# Patient Record
Sex: Male | Born: 1965
Health system: Southern US, Community
[De-identification: ages and names within clinical notes are randomized; demographics above are authoritative.]

## PROBLEM LIST (undated history)

## (undated) DIAGNOSIS — Z9109 Other allergy status, other than to drugs and biological substances: Secondary | ICD-10-CM

## (undated) DIAGNOSIS — G4730 Sleep apnea, unspecified: Secondary | ICD-10-CM

## (undated) DIAGNOSIS — R569 Unspecified convulsions: Secondary | ICD-10-CM

## (undated) HISTORY — PX: SEPTOPLASTY: SUR1290

## (undated) HISTORY — PX: PALATE / UVULA BIOPSY / EXCISION: SUR128

## (undated) HISTORY — DX: Sleep apnea, unspecified: G47.30

## (undated) HISTORY — DX: Unspecified convulsions: R56.9

## (undated) HISTORY — DX: Other allergy status, other than to drugs and biological substances: Z91.09

---

## 2004-08-16 ENCOUNTER — Emergency Department (HOSPITAL_COMMUNITY): Admission: EM | Admit: 2004-08-16 | Discharge: 2004-08-16 | Payer: Self-pay | Admitting: Family Medicine

## 2004-12-22 ENCOUNTER — Ambulatory Visit: Payer: Self-pay | Admitting: Internal Medicine

## 2005-01-19 ENCOUNTER — Ambulatory Visit: Payer: Self-pay | Admitting: Internal Medicine

## 2005-01-26 ENCOUNTER — Ambulatory Visit: Payer: Self-pay | Admitting: Internal Medicine

## 2005-02-08 ENCOUNTER — Ambulatory Visit: Payer: Self-pay | Admitting: Internal Medicine

## 2006-12-12 ENCOUNTER — Ambulatory Visit: Payer: Self-pay | Admitting: Internal Medicine

## 2006-12-12 LAB — CONVERTED CEMR LAB
ALT: 20 units/L (ref 0–40)
AST: 22 units/L (ref 0–37)
Albumin: 4.3 g/dL (ref 3.5–5.2)
Alkaline Phosphatase: 57 units/L (ref 39–117)
BUN: 17 mg/dL (ref 6–23)
Basophils Absolute: 0 10*3/uL (ref 0.0–0.1)
Calcium: 9.5 mg/dL (ref 8.4–10.5)
Chloride: 106 meq/L (ref 96–112)
Cholesterol: 232 mg/dL (ref 0–200)
Creatinine, Ser: 0.8 mg/dL (ref 0.4–1.5)
MCHC: 34.4 g/dL (ref 30.0–36.0)
Monocytes Relative: 9.7 % (ref 3.0–11.0)
Platelets: 204 10*3/uL (ref 150–400)
Potassium: 4.6 meq/L (ref 3.5–5.1)
RBC: 5.19 M/uL (ref 4.22–5.81)
RDW: 12.8 % (ref 11.5–14.6)
TSH: 1.29 microintl units/mL (ref 0.35–5.50)
Total Bilirubin: 0.9 mg/dL (ref 0.3–1.2)
Total CHOL/HDL Ratio: 3.5
Triglycerides: 93 mg/dL (ref 0–149)
VLDL: 19 mg/dL (ref 0–40)

## 2006-12-25 DIAGNOSIS — E785 Hyperlipidemia, unspecified: Secondary | ICD-10-CM | POA: Insufficient documentation

## 2006-12-26 ENCOUNTER — Ambulatory Visit: Payer: Self-pay | Admitting: Internal Medicine

## 2007-03-12 ENCOUNTER — Encounter: Payer: Self-pay | Admitting: *Deleted

## 2007-03-18 ENCOUNTER — Ambulatory Visit: Payer: Self-pay | Admitting: Internal Medicine

## 2007-03-18 LAB — CONVERTED CEMR LAB
Total CHOL/HDL Ratio: 3.3
Triglycerides: 61 mg/dL (ref 0–149)

## 2008-09-27 ENCOUNTER — Telehealth: Payer: Self-pay | Admitting: Internal Medicine

## 2008-09-29 ENCOUNTER — Ambulatory Visit: Payer: Self-pay | Admitting: Internal Medicine

## 2008-09-29 DIAGNOSIS — M4712 Other spondylosis with myelopathy, cervical region: Secondary | ICD-10-CM | POA: Insufficient documentation

## 2008-10-13 ENCOUNTER — Ambulatory Visit: Payer: Self-pay | Admitting: Internal Medicine

## 2008-10-14 ENCOUNTER — Encounter: Payer: Self-pay | Admitting: Internal Medicine

## 2010-01-27 IMAGING — CR DG CERVICAL SPINE WITH FLEX & EXTEND
7 series · 7 of 7 positions shown · non-contrast
Comparison: None

CLINICAL DATA: Cervical spondylosis and myelopathy.

CERVICAL SPINE COMPLETE WITH FLEXION AND EXTENSION VIEWS - 7 views

[view not recorded (1 of 7)]
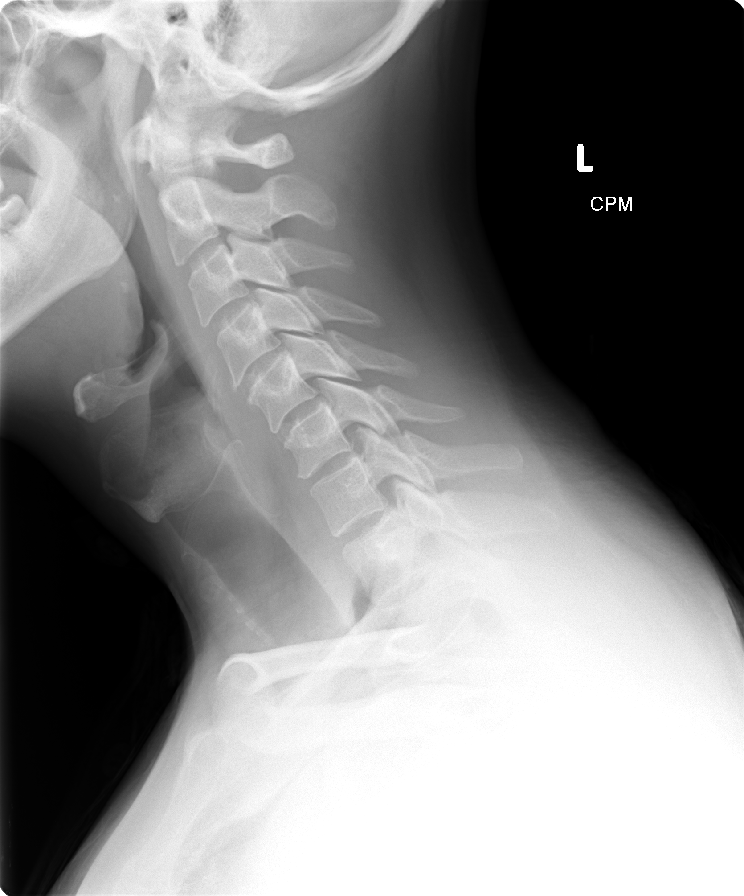

[view not recorded (2 of 7)]
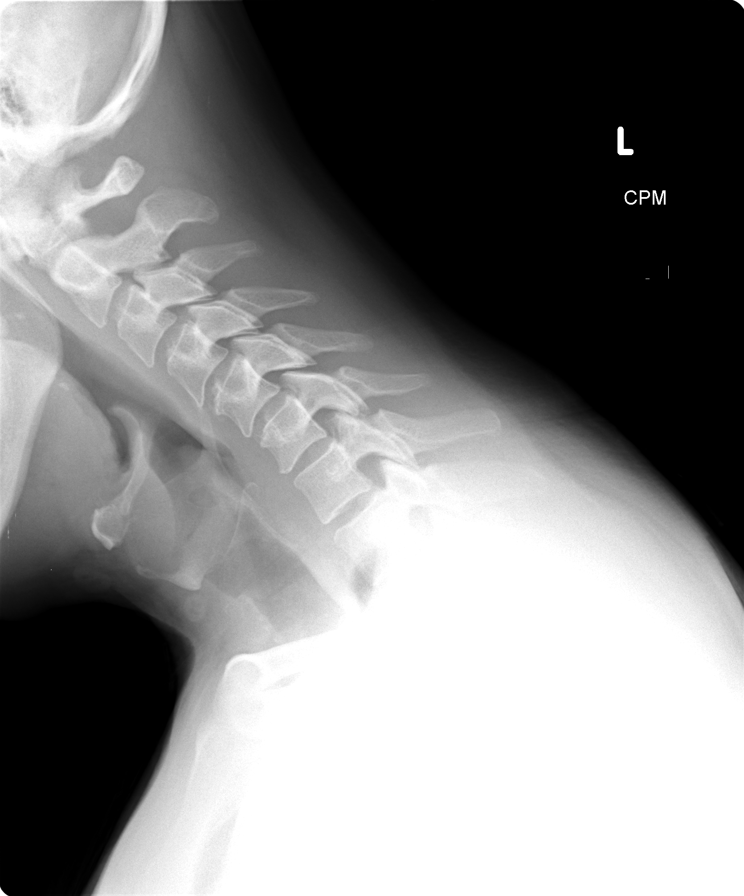

[view not recorded (3 of 7)]
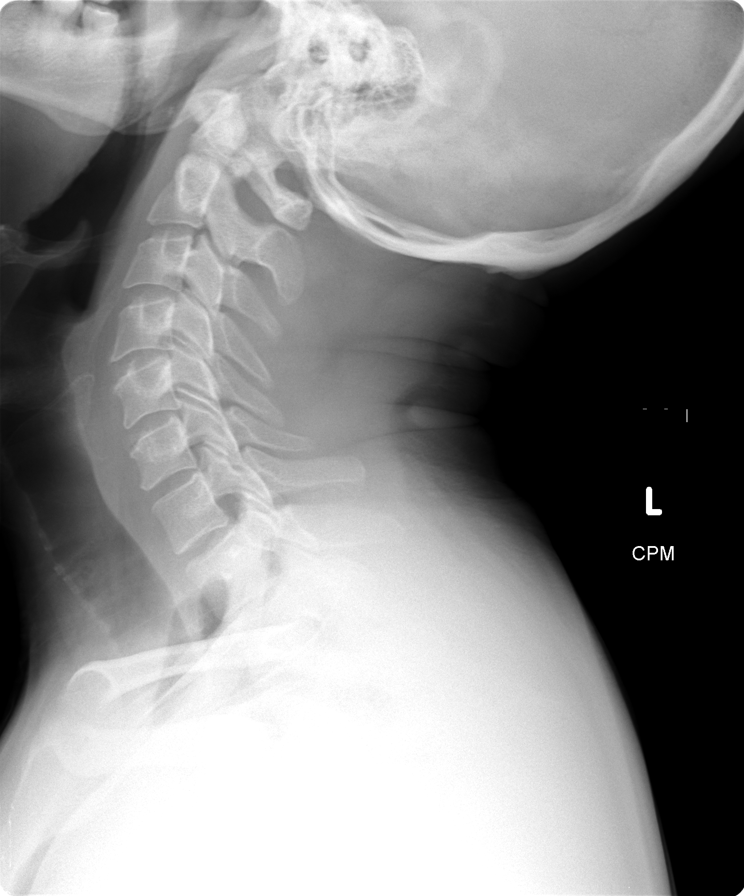

[view not recorded (4 of 7)]
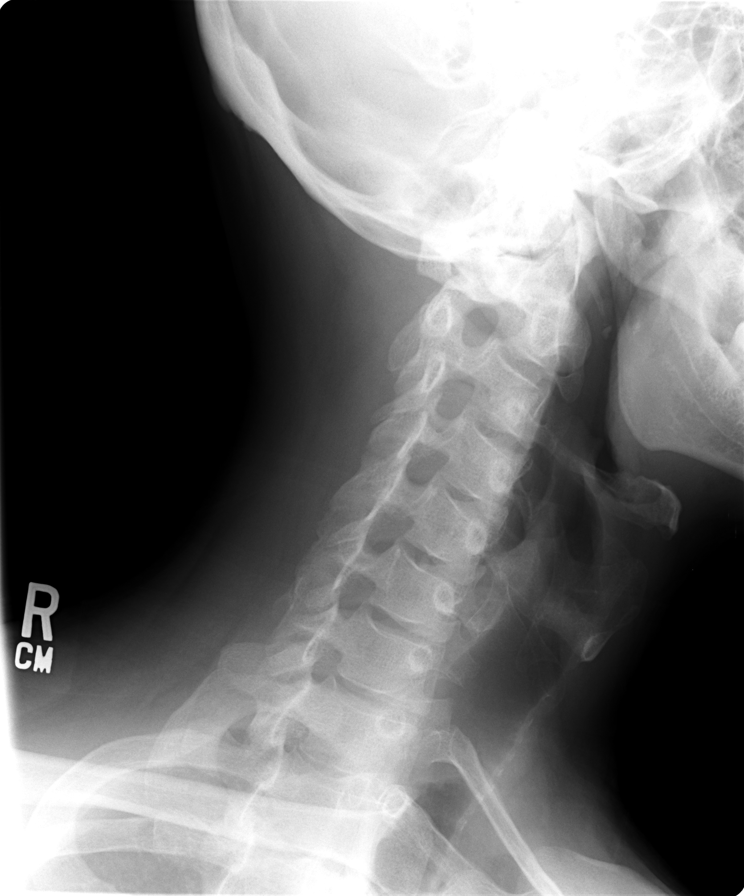

[view not recorded (5 of 7)]
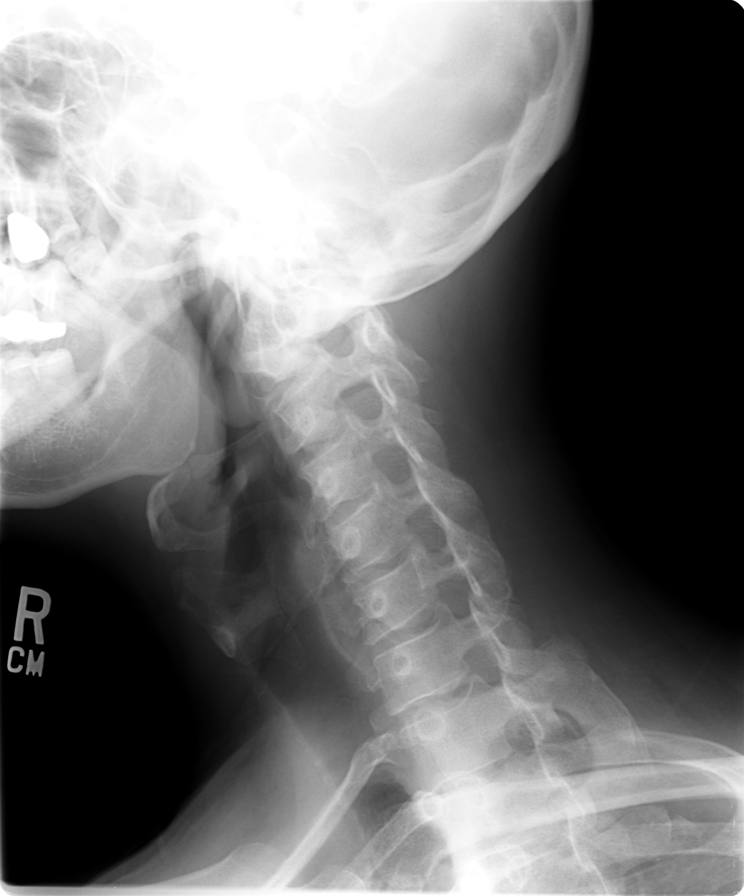

[view not recorded (6 of 7)]
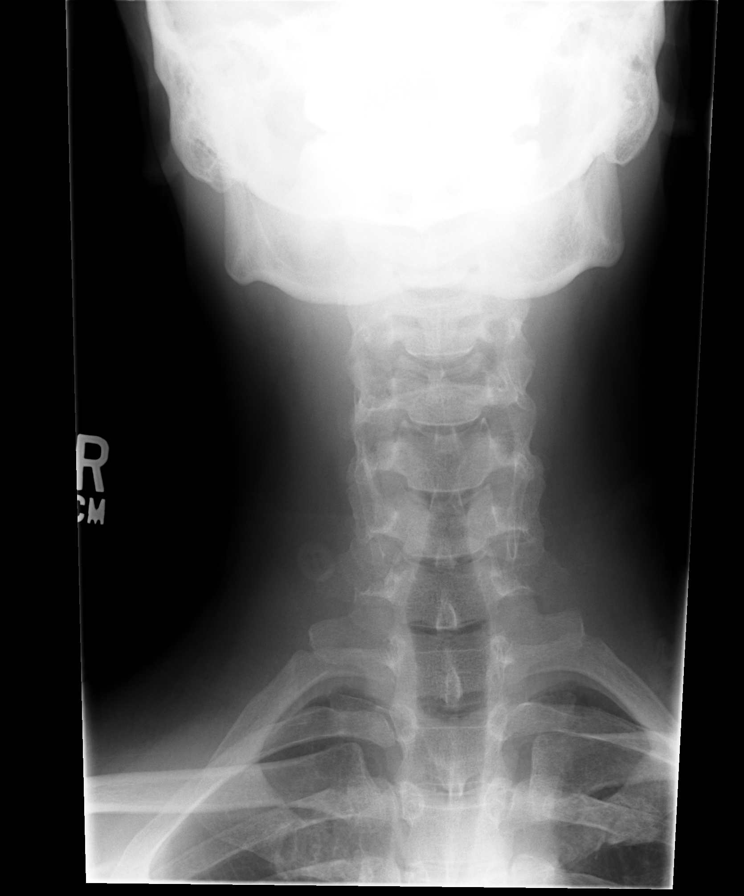

[view not recorded (7 of 7)]
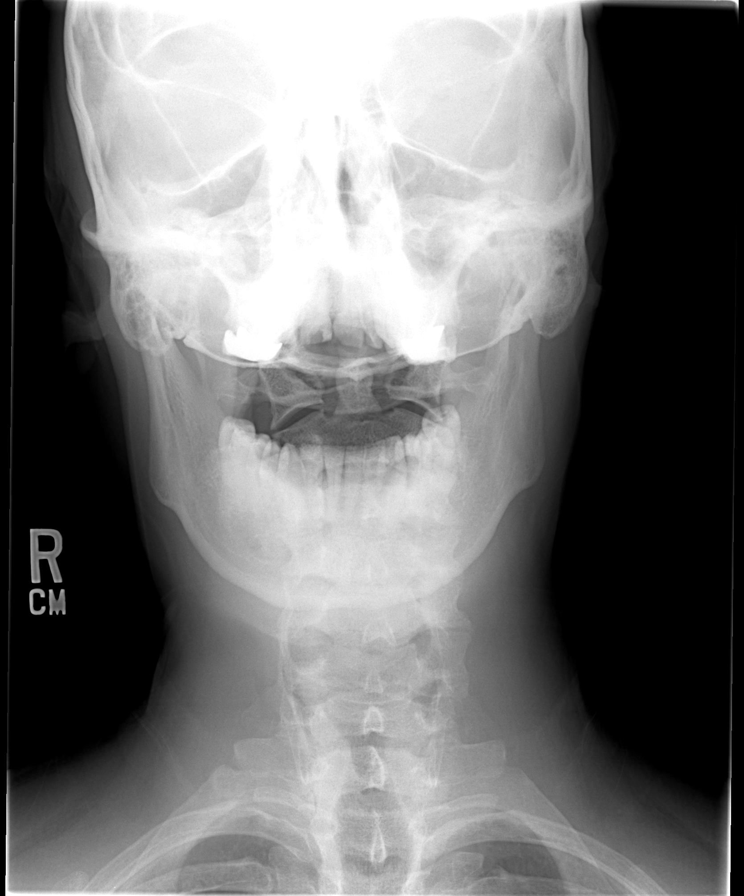

[7 of 7 positions shown; findings below may reference images not displayed]

FINDINGS: Routine cervical spine series shows no evidence of
cervical spine fracture, subluxation, or prevertebral soft tissue
swelling.  Mild anterior vertebral osteophyte formation is seen at
C4-5, C5-6, and C6-7, without evidence of disc space narrowing.
There is no evidence of facet arthropathy or neural foraminal
narrowing.

Mild cervical kyphosis is seen in neutral position.  Flexion and
extension views show normal range of motion, without evidence of
subluxation or other signs of cervical instability.
IMPRESSION: 1.  Early degenerative vertebral osteophyte formation from C4-C7,
without significant disc space narrowing.  Cervical kyphosis also
noted.
2.  No evidence of cervical subluxation or other signs of
ligamentous instability on flexion and extension views.

## 2010-05-24 ENCOUNTER — Ambulatory Visit: Payer: Self-pay | Admitting: Family Medicine

## 2010-05-24 DIAGNOSIS — J069 Acute upper respiratory infection, unspecified: Secondary | ICD-10-CM | POA: Insufficient documentation

## 2010-05-24 LAB — CONVERTED CEMR LAB: Rapid Strep: NEGATIVE

## 2010-09-12 NOTE — Assessment & Plan Note (Signed)
Summary: sore throat/stuffy nose/chest congestion/cjr   Vital Signs:  Patient profile:   45 year old male Weight:      171 pounds Temp:     98.3 degrees F oral BP sitting:   120 / 80  (left arm) Cuff size:   regular  Vitals Entered By: Sid Falcon LPN (May 24, 2010 3:54 PM)  History of Present Illness: Patient seen with one day history of sore throat, nasal congestion and body aches. Some malaise. No fever. No cough.  Around couple of individuals who had similar symptoms over the weekend. Nonsmoker. Took some Advil cold and sinus without much improvement.  Allergies (verified): No Known Drug Allergies  Past History:  Past Medical History: Last updated: 12/25/2006 Seizure disorder Hyperlipidemia/272.4  Review of Systems      See HPI  Physical Exam  General:  Well-developed,well-nourished,in no acute distress; alert,appropriate and cooperative throughout examination Ears:  External ear exam shows no significant lesions or deformities.  Otoscopic examination reveals clear canals, tympanic membranes are intact bilaterally without bulging, retraction, inflammation or discharge. Hearing is grossly normal bilaterally. Mouth:  Oral mucosa and oropharynx without lesions or exudates.  Teeth in good repair. Neck:  No deformities, masses, or tenderness noted. Lungs:  Normal respiratory effort, chest expands symmetrically. Lungs are clear to auscultation, no crackles or wheezes. Heart:  Normal rate and regular rhythm. S1 and S2 normal without gallop, murmur, click, rub or other extra sounds.   Impression & Recommendations:  Problem # 1:  SORE THROAT (ICD-462) strep neg.  Suspect viral URI.  Allerx and cont NSAIDS for symtpom relief. Orders: Rapid Strep (17616)  Complete Medication List: 1)  Phenytoin Sodium Extended 100 Mg Caps (Phenytoin sodium extended) .... 300 every other day,400 every other day-alternating 2)  Methylprednisolone 4 Mg Tabs (Methylprednisolone) .... Wo by  mouth two times a day for 3 days the one by mouth two times a day for 3 days and then 1 by mouth two times a day for 3 days the 1 by mouth daily for 3 days 3)  Propoxyphene N-apap 100-325 Mg Tabs (Propoxyphene n-apap) .... One by mouth q 4 hour as needed neck pain 4)  Diazepam 2 Mg Tabs (Diazepam) .... One by mouth q 6 hours 5)  Allerx Dose Pack Misc (Pse-methscop & cpm-pe-methscop) .... One by mouth two times a day as needed congestion  Patient Instructions: 1)  Get plenty of rest, drink lots of clear liquids, and use Tylenol or Ibuprofen for fever and comfort. Return in 7-10 days if you're not better: sooner if you'er feeling worse.  Prescriptions: ALLERX DOSE PACK  MISC (PSE-METHSCOP & CPM-PE-METHSCOP) one by mouth two times a day as needed congestion  #1 dose pack x 0   Entered and Authorized by:   Evelena Peat MD   Signed by:   Evelena Peat MD on 05/24/2010   Method used:   Electronically to        [facility] [address] [phone]* (retail)       [address]       [place], [place]  [address]       Ph: [phone] or [phone]       Fax: [phone]   RxID:   [id number]   Laboratory Results    Other Tests  Rapid Strep: negative

## 2010-10-12 ENCOUNTER — Telehealth: Payer: Self-pay | Admitting: *Deleted

## 2010-10-12 ENCOUNTER — Ambulatory Visit: Payer: 59 | Admitting: Internal Medicine

## 2010-10-12 DIAGNOSIS — Z0000 Encounter for general adult medical examination without abnormal findings: Secondary | ICD-10-CM

## 2010-10-12 DIAGNOSIS — Z23 Encounter for immunization: Secondary | ICD-10-CM

## 2010-10-12 MED ORDER — AMOXICILLIN-POT CLAVULANATE 875-125 MG PO TABS: 1.0000 | ORAL_TABLET | Freq: Two times a day (BID) | ORAL | Status: AC

## 2010-10-12 MED ORDER — TETANUS-DIPHTH-ACELL PERTUSSIS 5-2.5-18.5 LF-MCG/0.5 IM SUSP: 0.5000 mL | Freq: Once | INTRAMUSCULAR | Status: DC

## 2010-10-12 MED ORDER — TETANUS-DIPHTH-ACELL PERTUSSIS 5-2-15.5 LF-MCG/0.5 IM SUSP: 0.5000 mL | Freq: Once | INTRAMUSCULAR | Status: DC

## 2010-10-12 NOTE — Telephone Encounter (Signed)
Pt was bitten by a dog last night.  Deep penetrating wound to back of knee.  Per Dr. Lovell Sheehan, get Tdap, and Augmentin 875 mg. One po bid x 7 days.  Pt notified and appt scheduled.

## 2010-10-13 ENCOUNTER — Ambulatory Visit: Payer: Self-pay | Admitting: Internal Medicine

## 2010-12-04 ENCOUNTER — Other Ambulatory Visit: Payer: Self-pay | Admitting: Internal Medicine

## 2010-12-29 NOTE — Assessment & Plan Note (Signed)
[facility]                                 ON-CALL NOTE   XYLAN, SHEILS                        MRN:          [id number]  DATE:08/13/2006                            DOB:          August 05, 1966    PRIMARY CARE PHYSICIAN:  Darryll Capers, M.D.   PHONE NUMBER:  [phone].   The patient states he night have a sinus infection.  He had the onset of  symptoms 48-72 hours ago, low-grade fever, nasal congestion, hoarseness,  and cough without associated facial swelling or local face pain.  Currently is not having a fever and has no major underlying illness.  I  suggested that this could be a viral sinusitis and treat it with saline  Afrin, decongestant, and Tylenol.  If it is continuing in a week or more  without improvement or localized pain, etc., he should follow up in the  office with Dr. Lovell Sheehan or as needed with Berniece Andreas, MD.   Time of phone advice:  3 minutes and 40 seconds     Neta Mends. Panosh, MD  Electronically Signed    WKP/MedQ  DD: 08/13/2006  DT: 08/13/2006  Job #: [id number]

## 2011-09-26 ENCOUNTER — Other Ambulatory Visit: Payer: Self-pay | Admitting: Internal Medicine

## 2011-11-21 ENCOUNTER — Telehealth: Payer: Self-pay | Admitting: Internal Medicine

## 2011-11-21 NOTE — Telephone Encounter (Signed)
Patient called stating that his Therapist contacted the MD to refer him for ADHD meds and he has not heard anything back. Patient requesting a call back with update. Please assist.

## 2011-11-21 NOTE — Telephone Encounter (Signed)
Pt has ov 4-25 to discuss adhd

## 2011-12-06 ENCOUNTER — Ambulatory Visit (INDEPENDENT_AMBULATORY_CARE_PROVIDER_SITE_OTHER): Payer: 59 | Admitting: Internal Medicine

## 2011-12-06 ENCOUNTER — Encounter: Payer: Self-pay | Admitting: Internal Medicine

## 2011-12-06 VITALS — BP 130/74 | HR 72 | Temp 98.2°F | Resp 16 | Ht 70.0 in | Wt 174.0 lb

## 2011-12-06 DIAGNOSIS — T887XXA Unspecified adverse effect of drug or medicament, initial encounter: Secondary | ICD-10-CM

## 2011-12-06 DIAGNOSIS — R569 Unspecified convulsions: Secondary | ICD-10-CM

## 2011-12-06 LAB — HEPATIC FUNCTION PANEL
AST: 19 U/L (ref 0–37)
Albumin: 4.2 g/dL (ref 3.5–5.2)

## 2011-12-06 MED ORDER — LISDEXAMFETAMINE DIMESYLATE 30 MG PO CAPS: 30.0000 mg | ORAL_CAPSULE | ORAL | Status: DC

## 2011-12-06 NOTE — Patient Instructions (Signed)
The warning signs that this medication is not for you. 1. heart palpitations  2. Angry outbursts  3. Lack of focus  Stop the drug immediately if any of these 3 appear and contact our office Followup in 30 days to assess effectiveness to see if we need to increase the dose or change directions but at any time any of the 3 warning signs occur we will stop the drug and move in a different direction

## 2011-12-06 NOTE — Progress Notes (Signed)
  Subjective:    Patient ID: Bryan Archer, male    DOB: 08-25-1965, 46 y.o.   MRN: [id number]  HPI  The patient is a 46 year old of persist for followup of history of seizure disorder on stable medication management with last seizure being in 2001.  Patient also has a chief complaint today of attention deficit disorder and wishes to discuss evaluation and treatment plan for that.    Review of Systems  Constitutional: Negative for fever and fatigue.  HENT: Negative for hearing loss, congestion, neck pain and postnasal drip.   Eyes: Negative for discharge, redness and visual disturbance.  Respiratory: Negative for cough, shortness of breath and wheezing.   Cardiovascular: Negative for leg swelling.  Gastrointestinal: Negative for abdominal pain, constipation and abdominal distention.  Genitourinary: Negative for urgency and frequency.  Musculoskeletal: Negative for joint swelling and arthralgias.  Skin: Negative for color change and rash.  Neurological: Negative for weakness and light-headedness.  Hematological: Negative for adenopathy.  Psychiatric/Behavioral: Negative for behavioral problems.   Past Medical History  Diagnosis Date  . Seizures     History   Social History  . Marital Status: Single    Spouse Name: N/A    Number of Children: N/A  . Years of Education: N/A   Occupational History  . Not on file.   Social History Main Topics  . Smoking status: Not on file  . Smokeless tobacco: Not on file  . Alcohol Use:   . Drug Use:   . Sexually Active:    Other Topics Concern  . Not on file   Social History Narrative  . No narrative on file    No past surgical history on file.  No family history on file.  No Known Allergies  Current Outpatient Prescriptions on File Prior to Visit  Medication Sig Dispense Refill  . phenytoin (DILANTIN) 100 MG ER capsule TAKE 3 CAPSULES EVERY OTHER DAY ALTERNATING WITH 4 CAPSULE EVERY OTHER DAY  360 capsule  1   Current  Facility-Administered Medications on File Prior to Visit  Medication Dose Route Frequency Provider Last Rate Last Dose  . DISCONTD: TDaP (ADACEL) injection 0.5 mL  0.5 mL Intramuscular Once Stacie Glaze, MD      . DISCONTD: TDaP (BOOSTRIX) injection 0.5 mL  0.5 mL Intramuscular Once Stacie Glaze, MD        BP 130/74  Pulse 72  Temp 98.2 F (36.8 C)  Resp 16  Ht 5\' 10"  (1.778 m)  Wt 174 lb (78.926 kg)  BMI 24.97 kg/m2       Objective:   Physical Exam  Nursing note and vitals reviewed. Constitutional: He appears well-developed and well-nourished.  HENT:  Head: Normocephalic and atraumatic.  Eyes: Conjunctivae are normal. Pupils are equal, round, and reactive to light.  Neck: Normal range of motion. Neck supple.  Cardiovascular: Normal rate and regular rhythm.   Pulmonary/Chest: Effort normal and breath sounds normal.  Abdominal: Soft. Bowel sounds are normal.          Assessment & Plan:  ADD New diagnosis referred by therapist to begin ADD medications discussed the use of 5 bands 30 mg by mouth daily for ADD with followup.  Discussed case with neurology to make sure that the use of an EGD drug in a patient with a history of epilepsy is safe. Neurology consult and confirmed that the use of low-dose ADD medications was a prudent path And  Hx of seizures disorder

## 2012-04-13 ENCOUNTER — Other Ambulatory Visit: Payer: Self-pay | Admitting: Internal Medicine

## 2012-07-12 ENCOUNTER — Other Ambulatory Visit: Payer: Self-pay | Admitting: Internal Medicine

## 2012-11-15 ENCOUNTER — Other Ambulatory Visit: Payer: Self-pay | Admitting: Internal Medicine

## 2013-10-12 ENCOUNTER — Other Ambulatory Visit: Payer: 59

## 2013-10-12 ENCOUNTER — Other Ambulatory Visit: Payer: Self-pay | Admitting: Family Medicine

## 2013-10-12 DIAGNOSIS — Z209 Contact with and (suspected) exposure to unspecified communicable disease: Secondary | ICD-10-CM

## 2013-10-12 LAB — HIV ANTIBODY (ROUTINE TESTING W REFLEX): HIV: NONREACTIVE

## 2013-10-13 ENCOUNTER — Telehealth: Payer: Self-pay | Admitting: Family Medicine

## 2013-10-13 NOTE — Telephone Encounter (Signed)
He was given the results of his HIV test

## 2013-12-27 ENCOUNTER — Other Ambulatory Visit: Payer: Self-pay | Admitting: Internal Medicine

## 2013-12-29 NOTE — Telephone Encounter (Signed)
Not sure how I got this!

## 2014-03-05 ENCOUNTER — Other Ambulatory Visit: Payer: 59

## 2014-03-12 ENCOUNTER — Encounter: Payer: 59 | Admitting: Internal Medicine

## 2014-03-26 ENCOUNTER — Other Ambulatory Visit: Payer: Self-pay | Admitting: Internal Medicine

## 2014-03-29 ENCOUNTER — Telehealth: Payer: Self-pay

## 2014-03-29 NOTE — Telephone Encounter (Signed)
Left message on machine dr.lalonde wants pt to have a med check please call and make appointment

## 2014-04-06 ENCOUNTER — Encounter: Payer: Self-pay | Admitting: Family Medicine

## 2014-04-06 ENCOUNTER — Ambulatory Visit (INDEPENDENT_AMBULATORY_CARE_PROVIDER_SITE_OTHER): Payer: 59 | Admitting: Family Medicine

## 2014-04-06 VITALS — BP 124/78 | HR 72 | Wt 163.0 lb

## 2014-04-06 DIAGNOSIS — E785 Hyperlipidemia, unspecified: Secondary | ICD-10-CM

## 2014-04-06 DIAGNOSIS — R569 Unspecified convulsions: Secondary | ICD-10-CM

## 2014-04-06 LAB — CBC WITH DIFFERENTIAL/PLATELET
Basophils Absolute: 0 10*3/uL (ref 0.0–0.1)
Basophils Relative: 1 % (ref 0–1)
EOS ABS: 0.1 10*3/uL (ref 0.0–0.7)
EOS PCT: 2 % (ref 0–5)
HCT: 42.5 % (ref 39.0–52.0)
Hemoglobin: 15.1 g/dL (ref 13.0–17.0)
LYMPHS ABS: 1 10*3/uL (ref 0.7–4.0)
Lymphocytes Relative: 29 % (ref 12–46)
MCH: 30.8 pg (ref 26.0–34.0)
MCHC: 35.5 g/dL (ref 30.0–36.0)
MCV: 86.7 fL (ref 78.0–100.0)
Monocytes Absolute: 0.3 10*3/uL (ref 0.1–1.0)
Monocytes Relative: 9 % (ref 3–12)
Neutro Abs: 2.1 10*3/uL (ref 1.7–7.7)
Neutrophils Relative %: 59 % (ref 43–77)
PLATELETS: 190 10*3/uL (ref 150–400)
RBC: 4.9 MIL/uL (ref 4.22–5.81)
RDW: 13.2 % (ref 11.5–15.5)
WBC: 3.6 10*3/uL — ABNORMAL LOW (ref 4.0–10.5)

## 2014-04-06 MED ORDER — PHENYTOIN SODIUM EXTENDED 100 MG PO CAPS: ORAL_CAPSULE | ORAL | Status: DC

## 2014-04-06 NOTE — Progress Notes (Signed)
   Subjective:    Patient ID: Bryan Archer, male    DOB: [birth year], 48 y.o.   MRN: [id number]  HPI He is here for medication check. He will be switching to my care. He does have an underlying seizure disorder and is on Dilantin. His last seizure was in 2000. He was recently diagnosed at age 86 and placed on Dilantin. He did stop the medication on his own in 28. He had his neck seizure in 2000 and has been on Dilantin since then. Her last visit was in 2013. Review his record indicates that he has rarely used Adderall. Apparently was given to help with focus especially when he gets stressed. He has not had any formal evaluation for underlying ADD. He also has a history of hyperlipidemia.   Review of Systems     Objective:   Physical Exam Alert and in no distress otherwise not examined       Assessment & Plan:  SEIZURE DISORDER - Plan: Phenytoin level, free, phenytoin (DILANTIN) 100 MG ER capsule  HYPERLIPIDEMIA - Plan: CBC with Differential, Comprehensive metabolic panel, Lipid panel  He does plan on continuing on Dilantin the rest of his life. Discussed the use of Adderall and at this time I would not pursue making a definitive diagnosis since he rarely uses the medication. I explained that his symptoms could easily be stress related. He will set up for complete examination. He did not want a flu shot.

## 2014-04-07 LAB — COMPREHENSIVE METABOLIC PANEL
ALK PHOS: 50 U/L (ref 39–117)
ALT: 17 U/L (ref 0–53)
AST: 16 U/L (ref 0–37)
Albumin: 4.6 g/dL (ref 3.5–5.2)
BUN: 14 mg/dL (ref 6–23)
CO2: 30 mEq/L (ref 19–32)
CREATININE: 0.85 mg/dL (ref 0.50–1.35)
Calcium: 9.4 mg/dL (ref 8.4–10.5)
Chloride: 105 mEq/L (ref 96–112)
Glucose, Bld: 92 mg/dL (ref 70–99)
Potassium: 4.2 mEq/L (ref 3.5–5.3)
Sodium: 141 mEq/L (ref 135–145)
Total Bilirubin: 0.4 mg/dL (ref 0.2–1.2)
Total Protein: 6.6 g/dL (ref 6.0–8.3)

## 2014-04-07 LAB — LIPID PANEL
CHOLESTEROL: 195 mg/dL (ref 0–200)
HDL: 67 mg/dL (ref >39)
LDL CALC: 108 mg/dL — AB (ref 0–99)
TRIGLYCERIDES: 99 mg/dL (ref <150)
Total CHOL/HDL Ratio: 2.9 Ratio
VLDL: 20 mg/dL (ref 0–40)

## 2014-04-11 LAB — PHENYTOIN LEVEL, FREE AND TOTAL
PHENYTOIN, TOTAL: 6.3 mg/L — AB (ref 10.0–20.0)
Phenytoin Bound: 5.6 mg/L
Phenytoin, Free: 0.7 mg/L — ABNORMAL LOW (ref 1.0–2.0)

## 2014-04-12 NOTE — Addendum Note (Signed)
Addended by: Denita Lung on: 04/12/2014 08:23 AM   Modules accepted: Orders

## 2014-04-28 ENCOUNTER — Encounter: Payer: Self-pay | Admitting: Family Medicine

## 2014-04-28 ENCOUNTER — Ambulatory Visit (INDEPENDENT_AMBULATORY_CARE_PROVIDER_SITE_OTHER): Payer: 59 | Admitting: Family Medicine

## 2014-04-28 VITALS — BP 124/78 | HR 70 | Ht 68.0 in | Wt 165.0 lb

## 2014-04-28 DIAGNOSIS — J309 Allergic rhinitis, unspecified: Secondary | ICD-10-CM

## 2014-04-28 DIAGNOSIS — J3089 Other allergic rhinitis: Secondary | ICD-10-CM

## 2014-04-28 DIAGNOSIS — Z0000 Encounter for general adult medical examination without abnormal findings: Secondary | ICD-10-CM

## 2014-04-28 DIAGNOSIS — E785 Hyperlipidemia, unspecified: Secondary | ICD-10-CM

## 2014-04-28 DIAGNOSIS — R569 Unspecified convulsions: Secondary | ICD-10-CM

## 2014-04-28 DIAGNOSIS — Z209 Contact with and (suspected) exposure to unspecified communicable disease: Secondary | ICD-10-CM

## 2014-04-28 NOTE — Progress Notes (Signed)
   Subjective:    Patient ID: Bryan Archer, male    DOB: 10-20-1965, 48 y.o.   MRN: [id number]  HPI He is here for complete examination. He does have an underlying seizure disorder and presently is on alternating dosing of 300 and 400 mg Dilantin daily. Blood work was done recently. He also has a history of year-round allergies with nasal congestion sinus, sneezing, itchy watery eyes. He is using an OTC medication with good results. He does have a history of hyperlipidemia. His work and home life are going quite well. He did have HIV testing done in March which was negative. He does practice safe sex. Family and social history were reviewed.   Review of Systems  All other systems reviewed and are negative.      Objective:   Physical Exam BP 124/78  Pulse 70  Ht 5\' 8"  (1.727 m)  Wt 165 lb (74.844 kg)  BMI 25.09 kg/m2  SpO2 98%  General Appearance:    Alert, cooperative, no distress, appears stated age  Head:    Normocephalic, without obvious abnormality, atraumatic  Eyes:    PERRL, conjunctiva/corneas clear, EOM's intact, fundi    benign  Ears:    Normal TM's and external ear canals  Nose:   Nares normal, mucosa normal, no drainage or sinus   tenderness  Throat:   Lips, mucosa, and tongue normal; teeth and gums normal  Neck:   Supple, no lymphadenopathy;  thyroid:  no   enlargement/tenderness/nodules; no carotid   bruit or JVD  Back:    Spine nontender, no curvature, ROM normal, no CVA     tenderness  Lungs:     Clear to auscultation bilaterally without wheezes, rales or     ronchi; respirations unlabored  Chest Wall:    No tenderness or deformity   Heart:    Regular rate and rhythm, S1 and S2 normal, no murmur, rub   or gallop  Breast Exam:    No chest wall tenderness, masses or gynecomastia  Abdomen:     Soft, non-tender, nondistended, normoactive bowel sounds,    no masses, no hepatosplenomegaly  Genitalia:    Normal male external genitalia without lesions.  Testicles  without masses.  No inguinal hernias.  Rectal:   deferred   Extremities:   No clubbing, cyanosis or edema  Pulses:   2+ and symmetric all extremities  Skin:   Skin color, texture, turgor normal, no rashes or lesions  Lymph nodes:   Cervical, supraclavicular, and axillary nodes normal  Neurologic:   CNII-XII intact, normal strength, sensation and gait; reflexes 2+ and symmetric throughout          Psych:   Normal mood, affect, hygiene and grooming.          Assessment & Plan:  Routine general medical examination at a health care facility  SEIZURE DISORDER - Plan: Phenytoin level, total  HYPERLIPIDEMIA  Perennial allergic rhinitis  Contact with or exposure to unspecified communicable disease - Plan: HIV antibody  I will have him increase his Dilantin to 400 mg daily and check a level in one month. Continue on present allergy treatment regimen. Discussed PREP with him however he is comfortable with his present protection methods. Flu shot offered but refused.

## 2014-04-29 LAB — HIV ANTIBODY (ROUTINE TESTING W REFLEX): HIV 1&2 Ab, 4th Generation: NONREACTIVE

## 2014-05-28 ENCOUNTER — Other Ambulatory Visit (INDEPENDENT_AMBULATORY_CARE_PROVIDER_SITE_OTHER): Payer: 59

## 2014-05-28 DIAGNOSIS — Z23 Encounter for immunization: Secondary | ICD-10-CM

## 2014-05-28 DIAGNOSIS — R569 Unspecified convulsions: Secondary | ICD-10-CM

## 2014-05-29 LAB — PHENYTOIN LEVEL, TOTAL: Phenytoin Lvl: 8.6 ug/mL — ABNORMAL LOW (ref 10.0–20.0)

## 2014-06-19 ENCOUNTER — Encounter: Payer: Self-pay | Admitting: Family Medicine

## 2014-06-20 ENCOUNTER — Emergency Department (HOSPITAL_COMMUNITY)
Admission: EM | Admit: 2014-06-20 | Discharge: 2014-06-20 | Disposition: A | Discharge status: Home / Self Care | Payer: 59 | Source: Home / Self Care | Attending: Family Medicine | Admitting: Family Medicine

## 2014-06-20 ENCOUNTER — Encounter (HOSPITAL_COMMUNITY): Payer: Self-pay | Admitting: *Deleted

## 2014-06-20 ENCOUNTER — Encounter: Payer: Self-pay | Admitting: Family Medicine

## 2014-06-20 DIAGNOSIS — L2381 Allergic contact dermatitis due to animal (cat) (dog) dander: Secondary | ICD-10-CM

## 2014-06-20 MED ORDER — TRIAMCINOLONE ACETONIDE 40 MG/ML IJ SUSP
40.0000 mg | Freq: Once | INTRAMUSCULAR | Status: AC
  Administered 2014-06-20: 40 mg via INTRAMUSCULAR

## 2014-06-20 MED ORDER — TRIAMCINOLONE ACETONIDE 40 MG/ML IJ SUSP
INTRAMUSCULAR | Status: AC
  Filled 2014-06-20: qty 1

## 2014-06-20 MED ORDER — METHYLPREDNISOLONE ACETATE PF 80 MG/ML IJ SUSP
80.0000 mg | Freq: Once | INTRAMUSCULAR | Status: AC
  Administered 2014-06-20: 80 mg via INTRAMUSCULAR

## 2014-06-20 MED ORDER — METHYLPREDNISOLONE ACETATE 80 MG/ML IJ SUSP
INTRAMUSCULAR | Status: AC
  Filled 2014-06-20: qty 1

## 2014-06-20 NOTE — Discharge Instructions (Signed)
Continue benadryl as needed,see your doctor if further problems

## 2014-06-20 NOTE — ED Notes (Signed)
Pt     Reports       Rash  On  Hands   With  Tingling        And  Swelling          X   3  Days             no  Angio  Edema     Speaking   In  Complete  sentances   And  Is  In no  Acute  Distress     Hands  Are  Red

## 2014-06-20 NOTE — ED Provider Notes (Signed)
CSN: [id number]     Arrival date & time 06/20/14  1610 History   First MD Initiated Contact with Patient 06/20/14 1628     Chief Complaint  Patient presents with  . Rash   (Consider location/radiation/quality/duration/timing/severity/associated sxs/prior Treatment) Patient is a 48 y.o. male presenting with rash. The history is provided by the patient.  Rash Location:  Hand Hand rash location:  R palm and L palm Quality: itchiness and redness   Severity:  Mild Duration:  2 days Progression:  Unchanged Chronicity:  New Context: animal contact   Context comment:  Onset after petting new cat that was adopted. Associated symptoms: no shortness of breath, no throat swelling and no tongue swelling     Past Medical History  Diagnosis Date  . Seizures    History reviewed. No pertinent past surgical history. Family History  Problem Relation Age of Onset  . Stroke Mother   . Heart disease Father 70    CABG   History  Substance Use Topics  . Smoking status: Former Smoker    Quit date: 04/29/1999  . Smokeless tobacco: Not on file  . Alcohol Use: 4.2 oz/week    7 Glasses of wine per week    Review of Systems  Constitutional: Negative.   Respiratory: Negative for shortness of breath.   Musculoskeletal: Negative.   Skin: Positive for rash.    Allergies  Review of patient's allergies indicates no known allergies.  Home Medications   Prior to Admission medications   Medication Sig Start Date End Date Taking? Authorizing Provider  phenytoin (DILANTIN) 100 MG ER capsule TAKE 3 CAPSULES EVERY OTHER DAY, ALTERNATING WITH 4 CAPSULES EVERY OTHER DAY 04/06/14   Denita Lung, MD   BP 119/82 mmHg  Pulse 96  Temp(Src) 97.7 F (36.5 C) (Oral)  Resp 16  SpO2 96% Physical Exam  Constitutional: He is oriented to person, place, and time. He appears well-developed and well-nourished. No distress.  Neurological: He is alert and oriented to person, place, and time.  Skin: Skin is warm  and dry. Rash noted. There is erythema.  Nursing note and vitals reviewed.   ED Course  Procedures (including critical care time) Labs Review Labs Reviewed - No data to display  Imaging Review No results found.   MDM   1. Allergic contact dermatitis due to animal dander        Billy Fischer, MD 06/20/14 [phone]

## 2014-06-21 ENCOUNTER — Encounter: Payer: Self-pay | Admitting: Family Medicine

## 2014-06-21 NOTE — Telephone Encounter (Signed)
Pt called to see if emails had been received. Please email pt.

## 2014-06-22 ENCOUNTER — Ambulatory Visit (INDEPENDENT_AMBULATORY_CARE_PROVIDER_SITE_OTHER): Payer: 59 | Admitting: Family Medicine

## 2014-06-22 ENCOUNTER — Encounter: Payer: Self-pay | Admitting: Family Medicine

## 2014-06-22 VITALS — BP 120/78 | HR 73 | Wt 168.0 lb

## 2014-06-22 DIAGNOSIS — L309 Dermatitis, unspecified: Secondary | ICD-10-CM

## 2014-06-22 NOTE — Patient Instructions (Addendum)
Use Claritin or Allegra and you can also take Tagamet If you are still having difficulty after the weekend call and I will try to get you in to see her dermatologist

## 2014-06-22 NOTE — Progress Notes (Signed)
   Subjective:    Patient ID: Bryan Archer, male    DOB: 07-24-1966, 48 y.o.   MRN: [id number]  HPI He is here for evaluation of a rash. Last Thursday he noted a tingling sensation in his fingers and toes. The problem continued and he was seen in an urgent care center. He was given prednisone and Benadryl last Sunday. This continues to cause difficulty mainly with itching. He has had no new soaps, detergents, medications or known exposure to anything he is aware of.   Review of Systems     Objective:   Physical Exam Alert and in no distress. Exam of both hands does show palmaris erythema with papular lesions present. He also has lesions present between his fingers on the dorsal surface. Exam of his torso and feet was negative.       Assessment & Plan:  Dermatitis I explained that I did not think he was in any danger but cannot state exactly what is causing his trouble. When he returns from a trip to [place], if he still has difficulty he will call me for help expediting seeing a dermatologist. He has seen Dr. Tonia Brooms in the past.

## 2014-07-18 ENCOUNTER — Emergency Department (HOSPITAL_COMMUNITY): Payer: 59

## 2014-07-18 ENCOUNTER — Emergency Department (HOSPITAL_COMMUNITY)
Admission: EM | Admit: 2014-07-18 | Discharge: 2014-07-18 | Disposition: A | Discharge status: Home / Self Care | Payer: 59 | Attending: Emergency Medicine | Admitting: Emergency Medicine

## 2014-07-18 ENCOUNTER — Encounter (HOSPITAL_COMMUNITY): Payer: Self-pay | Admitting: Emergency Medicine

## 2014-07-18 ENCOUNTER — Other Ambulatory Visit: Payer: Self-pay

## 2014-07-18 ENCOUNTER — Emergency Department (HOSPITAL_COMMUNITY)
Admission: EM | Admit: 2014-07-18 | Discharge: 2014-07-18 | Disposition: A | Discharge status: Home / Self Care | Payer: 59 | Source: Home / Self Care | Attending: Family Medicine | Admitting: Family Medicine

## 2014-07-18 ENCOUNTER — Encounter (HOSPITAL_COMMUNITY): Payer: Self-pay | Admitting: *Deleted

## 2014-07-18 ENCOUNTER — Encounter: Payer: Self-pay | Admitting: Family Medicine

## 2014-07-18 DIAGNOSIS — G40909 Epilepsy, unspecified, not intractable, without status epilepticus: Secondary | ICD-10-CM | POA: Insufficient documentation

## 2014-07-18 DIAGNOSIS — Z87891 Personal history of nicotine dependence: Secondary | ICD-10-CM | POA: Insufficient documentation

## 2014-07-18 DIAGNOSIS — R079 Chest pain, unspecified: Secondary | ICD-10-CM

## 2014-07-18 DIAGNOSIS — R0789 Other chest pain: Secondary | ICD-10-CM

## 2014-07-18 LAB — BASIC METABOLIC PANEL
ANION GAP: 10 (ref 5–15)
BUN: 15 mg/dL (ref 6–23)
CO2: 30 mEq/L (ref 19–32)
Calcium: 9.7 mg/dL (ref 8.4–10.5)
Chloride: 103 mEq/L (ref 96–112)
Creatinine, Ser: 0.79 mg/dL (ref 0.50–1.35)
GFR calc non Af Amer: >90 mL/min (ref >90)
Glucose, Bld: 88 mg/dL (ref 70–99)
POTASSIUM: 4.7 meq/L (ref 3.7–5.3)
SODIUM: 143 meq/L (ref 137–147)

## 2014-07-18 LAB — I-STAT TROPONIN, ED
Troponin i, poc: 0 ng/mL (ref 0.00–0.08)
Troponin i, poc: 0 ng/mL (ref 0.00–0.08)

## 2014-07-18 LAB — CBC
HCT: 45.4 % (ref 39.0–52.0)
Hemoglobin: 15.9 g/dL (ref 13.0–17.0)
MCH: 30.8 pg (ref 26.0–34.0)
MCHC: 35 g/dL (ref 30.0–36.0)
MCV: 87.8 fL (ref 78.0–100.0)
PLATELETS: 173 10*3/uL (ref 150–400)
RBC: 5.17 MIL/uL (ref 4.22–5.81)
RDW: 12.4 % (ref 11.5–15.5)
WBC: 3.7 10*3/uL — AB (ref 4.0–10.5)

## 2014-07-18 NOTE — Discharge Instructions (Signed)

## 2014-07-18 NOTE — ED Notes (Addendum)
Pt declined EMS transfer.  States family member will drive him directly to ED.  Attempt to notify ED First RN without success.

## 2014-07-18 NOTE — Discharge Instructions (Signed)
Go directly to the emergency room

## 2014-07-18 NOTE — ED Notes (Signed)
[place], ED First RN notified of pt.

## 2014-07-18 NOTE — ED Notes (Signed)
Reports sudden onset substernal chest pain that has since moved to left chest pain.  Describes as a tightness.  Denies SOB, diaphoresis, n/v, radiation of pain.  Denies hx.  Pain has been constant.  Skin W/D/P.  EKG in progress.

## 2014-07-18 NOTE — ED Provider Notes (Signed)
CSN: [id number]     Arrival date & time 07/18/14  1023 History   First MD Initiated Contact with Patient 07/18/14 1207     Chief Complaint  Patient presents with  . Chest Pain     (Consider location/radiation/quality/duration/timing/severity/associated sxs/prior Treatment) HPI   Bryan Archer is a 48 y.o. male  who developed chest pain, left anterior, while reading paper drink coffee this morning.  The pain was central chest, and is described as a tight feeling" The pain moved to the left side of his chest.  The pain was moderate in intensity initially, and now is mild in intensity.  There was no associated diaphoresis, nausea, vomiting, weakness or dizziness.  Never had this previously.  He has occasional episodes of heartburn for which he takes Tums, but that pain, is different than the discomfort today.  He has never had cardiac problems.  Family history is positive for coronary artery disease in his father at age 32.  He does not smoke cigarettes or use illegal drugs.  He had a change in his Dilantin dosing, 2 months ago.  There are no other known modifying factors.  Past Medical History  Diagnosis Date  . Seizures    History reviewed. No pertinent past surgical history. Family History  Problem Relation Age of Onset  . Stroke Mother   . Heart disease Father 16    CABG   History  Substance Use Topics  . Smoking status: Former Smoker    Quit date: 04/29/1999  . Smokeless tobacco: Not on file  . Alcohol Use: Yes     Comment: occasional    Review of Systems  All other systems reviewed and are negative.     Allergies  Review of patient's allergies indicates no known allergies.  Home Medications   Prior to Admission medications   Medication Sig Start Date End Date Taking? Authorizing Provider  phenytoin (DILANTIN) 100 MG ER capsule TAKE 3 CAPSULES EVERY OTHER DAY, ALTERNATING WITH 4 CAPSULES EVERY OTHER DAY Patient taking differently: Take 400 mg by mouth daily.   04/06/14  Yes Denita Lung, MD  Pseudoephedrine-Ibuprofen (IBUPROFEN AND PSE COLD & SINUS) 30-200 MG TABS Take 1 tablet by mouth every 6 (six) hours as needed (sinus headache).   Yes Historical Provider, MD   BP 118/78 mmHg  Pulse 67  Temp(Src) 97.6 F (36.4 C) (Oral)  Resp 14  SpO2 99% Physical Exam  Constitutional: He is oriented to person, place, and time. He appears well-developed and well-nourished.  HENT:  Head: Normocephalic and atraumatic.  Right Ear: External ear normal.  Left Ear: External ear normal.  Eyes: Conjunctivae and EOM are normal. Pupils are equal, round, and reactive to light.  Neck: Normal range of motion and phonation normal. Neck supple.  Cardiovascular: Normal rate, regular rhythm and normal heart sounds.   Pulmonary/Chest: Effort normal and breath sounds normal. He exhibits no tenderness and no bony tenderness.  Abdominal: Soft. There is no tenderness.  Musculoskeletal: Normal range of motion.  Neurological: He is alert and oriented to person, place, and time. No cranial nerve deficit or sensory deficit. He exhibits normal muscle tone. Coordination normal.  Skin: Skin is warm, dry and intact.  Psychiatric: He has a normal mood and affect. His behavior is normal. Judgment and thought content normal.  Nursing note and vitals reviewed.   ED Course  Procedures (including critical care time)  Medications - No data to display  Patient Vitals for the past 24 hrs:  BP Temp Temp src Pulse Resp SpO2  07/18/14 1314 118/78 mmHg - - 67 14 99 %  07/18/14 1228 118/79 mmHg - - 68 16 97 %  07/18/14 1126 123/80 mmHg - - 68 16 98 %  07/18/14 1110 - - - 71 16 100 %  07/18/14 1108 123/80 mmHg - - - - -  07/18/14 1029 139/93 mmHg 97.6 F (36.4 C) Oral 73 18 100 %    1:5o PM Reevaluation with update and discussion. After initial assessment and treatment, an updated evaluation reveals he remains comfortable, no further complaints, no chest pain at this time.  [place] Review Labs Reviewed  CBC - Abnormal; Notable for the following:    WBC 3.7 (*)    All other components within normal limits  BASIC METABOLIC PANEL  I-STAT TROPOININ, ED  Randolm Idol, ED    Imaging Review Dg Chest 2 View  07/18/2014   CLINICAL DATA:  48 year old male with left-sided chest pain which began today. No associated shortness of breath.  EXAM: CHEST  2 VIEW  COMPARISON:  No priors.  FINDINGS: Lung volumes are normal. No consolidative airspace disease. No pleural effusions. No pneumothorax. No pulmonary nodule or mass noted. Pulmonary vasculature and the cardiomediastinal silhouette are within normal limits.  IMPRESSION: No radiographic evidence of acute cardiopulmonary disease.   Electronically Signed   By: Vinnie Langton M.D.   On: 07/18/2014 13:28     EKG Interpretation None          Date: 07/18/14  Rate: 69  Rhythm: normal sinus rhythm  QRS Axis: normal  PR and QT Intervals: normal  ST/T Wave abnormalities: normal  PR and QRS Conduction Disutrbances:none  Narrative Interpretation:   Old EKG Reviewed: none available     Date: 07/18/14- 13:38  Rate: 67  Rhythm: normal sinus rhythm  QRS Axis: normal  PR and QT Intervals: normal  ST/T Wave abnormalities: normal  PR and QRS Conduction Disutrbances:none  Narrative Interpretation:   Old EKG Reviewed: unchanged cf. earlier    MDM   Final diagnoses:  Nonspecific chest pain    Nonspecific chest pain, low cardiac risk profile.  The discomfort is atypical for coronary artery disease pain.  There is no evidence for infarct delta troponin.  Doubt PE or pneumonia.  Nursing Notes Reviewed/ Care Coordinated Applicable Imaging Reviewed Interpretation of Laboratory Data incorporated into ED treatment  The patient appears reasonably screened and/or stabilized for discharge and I doubt any other medical condition or other [facility] requiring further screening, evaluation, or treatment in the ED  at this time prior to discharge.  Plan: Home Medications- usual; Home Treatments- rest; return here if the recommended treatment, does not improve the symptoms; Recommended follow up- PCP 1 week to discuss further evaluation and possible Cardiac Stress Test.    Richarda Blade, MD 07/18/14 1359

## 2014-07-18 NOTE — ED Provider Notes (Signed)
Bryan Archer is a 48 y.o. male who presents to Urgent Care today for chest pain. Patient developed central chest pain at 8:30 this morning. He feels a band like tightness across his chest. The chest pain is now left-sided. The pain does not radiate. He feels though his heart is racing somewhat but states that he probably is due to anxiety. The pain is worse with motion change and exertion. He took 325 mg of aspirin at the onset of pain. He denies any prior history of chest problems. He feels well otherwise. No nausea vomiting diarrhea sweating or feeling of doom.   Past Medical History  Diagnosis Date  . Seizures    History reviewed. No pertinent past surgical history. History  Substance Use Topics  . Smoking status: Former Smoker    Quit date: 04/29/1999  . Smokeless tobacco: Not on file  . Alcohol Use: Yes     Comment: occasional   ROS as above Medications: No current facility-administered medications for this encounter.   Current Outpatient Prescriptions  Medication Sig Dispense Refill  . phenytoin (DILANTIN) 100 MG ER capsule TAKE 3 CAPSULES EVERY OTHER DAY, ALTERNATING WITH 4 CAPSULES EVERY OTHER DAY (Patient taking differently: No sig reported) 360 capsule 3   No Known Allergies   Exam:  BP 136/95 mmHg  Pulse 76  Temp(Src) 97.8 F (36.6 C) (Oral)  Resp 16  SpO2 100% Gen: Well NAD HEENT: EOMI,  MMM Lungs: Normal work of breathing. CTABL Heart: RRR no MRG Abd: NABS, Soft. Nondistended, Nontender Exts: Brisk capillary refill, warm and well perfused.   Twelve-lead EKG shows normal sinus rhythm at 69 bpm. No ST segment elevation or depression. No significant Q waves. QTC 402. Normal EKG.  No results found for this or any previous visit (from the past 24 hour(s)). No results found.  Assessment and Plan: 48 y.o. male with chest pain. Unclear etiology. Discussed options with patient. Recommend transfer to the emergency department for evaluation management. Patient  declined EMS transport. His partner will drive him. No medications given in clinic.   Discussed warning signs or symptoms. Please see discharge instructions. Patient expresses understanding.     Gregor Hams, MD 07/18/14 [phone]

## 2014-07-18 NOTE — ED Notes (Signed)
Pt. Stated, i got up this morning to read the paper and i started having pressure in my chest.  I was a 6 and now its a 3.  i took 4 baby ASA

## 2014-07-20 ENCOUNTER — Ambulatory Visit (INDEPENDENT_AMBULATORY_CARE_PROVIDER_SITE_OTHER): Payer: 59 | Admitting: Family Medicine

## 2014-07-20 DIAGNOSIS — M940 Chondrocostal junction syndrome [Tietze]: Secondary | ICD-10-CM

## 2014-07-20 NOTE — Progress Notes (Signed)
   Subjective:    Patient ID: Bryan Archer, male    DOB: 05/12/1966, 48 y.o.   MRN: [id number]  HPI He is here for consult concerning recent episode of left-sided chest pain. He did go to the emergency room. The emergency room record was reviewed. The workup was negative. Further history indicates that the pain started insidiously on the left side of the chest. While going to the hospital he did note that the pain was made worse with motion. The pain is diminished and he did have one slight episode of this yesterday. Family history significant for father having bypass grafting at age 43. He does not smoke.   Review of Systems     Objective:   Physical Exam Alert and in no distress. Slight tenderness palpation is noted over the left third costochondral junction.       Assessment & Plan:  Costochondritis  I explained that most likely his chest pain is chest wall in nature and explained the mechanism of action. Recommend Advil or Aleve if he has further trouble. Also discussed potential follow-up concerning his family history of heart disease. Explained that at this point cardiac workup would not necessarily be warranted although we could probably get it if he so desired. He was comfortable with not pursuing a workup at this time

## 2015-04-15 ENCOUNTER — Encounter: Payer: Self-pay | Admitting: Family Medicine

## 2015-04-27 ENCOUNTER — Encounter: Payer: Self-pay | Admitting: Family Medicine

## 2015-04-28 ENCOUNTER — Other Ambulatory Visit: Payer: Self-pay | Admitting: Family Medicine

## 2015-04-28 DIAGNOSIS — Z1211 Encounter for screening for malignant neoplasm of colon: Secondary | ICD-10-CM

## 2015-04-29 ENCOUNTER — Encounter: Payer: Self-pay | Admitting: Internal Medicine

## 2015-06-04 ENCOUNTER — Other Ambulatory Visit: Payer: Self-pay | Admitting: Family Medicine

## 2015-06-06 NOTE — Telephone Encounter (Signed)
Is this okay last appt 12/15

## 2015-06-29 ENCOUNTER — Ambulatory Visit (AMBULATORY_SURGERY_CENTER): Payer: Self-pay

## 2015-06-29 VITALS — Ht 68.0 in | Wt 162.0 lb

## 2015-06-29 DIAGNOSIS — Z1211 Encounter for screening for malignant neoplasm of colon: Secondary | ICD-10-CM

## 2015-06-29 MED ORDER — SUPREP BOWEL PREP KIT 17.5-3.13-1.6 GM/177ML PO SOLN: 1.0000 | Freq: Once | ORAL | Status: DC

## 2015-06-29 NOTE — Progress Notes (Signed)
No allergies to eggs or soy No past problems with anesthesia No home oxygen No diet/weight loss meds  Has email and internet; registered emmi 

## 2015-07-04 ENCOUNTER — Encounter: Payer: Self-pay | Admitting: Internal Medicine

## 2015-07-13 ENCOUNTER — Encounter: Payer: Self-pay | Admitting: Internal Medicine

## 2015-07-13 ENCOUNTER — Ambulatory Visit (AMBULATORY_SURGERY_CENTER): Payer: 59 | Admitting: Internal Medicine

## 2015-07-13 VITALS — BP 98/69 | HR 64 | Temp 95.6°F | Resp 23 | Ht 68.0 in | Wt 162.0 lb

## 2015-07-13 DIAGNOSIS — D123 Benign neoplasm of transverse colon: Secondary | ICD-10-CM

## 2015-07-13 DIAGNOSIS — Z1211 Encounter for screening for malignant neoplasm of colon: Secondary | ICD-10-CM | POA: Diagnosis present

## 2015-07-13 MED ORDER — SODIUM CHLORIDE 0.9 % IV SOLN: 500.0000 mL | INTRAVENOUS | Status: DC

## 2015-07-13 NOTE — Progress Notes (Signed)
Patient awakening,vss,report to rn 

## 2015-07-13 NOTE — Patient Instructions (Signed)
YOU HAD AN ENDOSCOPIC PROCEDURE TODAY AT THE [facility]:   Refer to the procedure report that was given to you for any specific questions about what was found during the examination.  If the procedure report does not answer your questions, please call your gastroenterologist to clarify.  If you requested that your care partner not be given the details of your procedure findings, then the procedure report has been included in a sealed envelope for you to review at your convenience later.  YOU SHOULD EXPECT: Some feelings of bloating in the abdomen. Passage of more gas than usual.  Walking can help get rid of the air that was put into your GI tract during the procedure and reduce the bloating. If you had a lower endoscopy (such as a colonoscopy or flexible sigmoidoscopy) you may notice spotting of blood in your stool or on the toilet paper. If you underwent a bowel prep for your procedure, you may not have a normal bowel movement for a few days.  Please Note:  You might notice some irritation and congestion in your nose or some drainage.  This is from the oxygen used during your procedure.  There is no need for concern and it should clear up in a day or so.  SYMPTOMS TO REPORT IMMEDIATELY:   Following lower endoscopy (colonoscopy or flexible sigmoidoscopy):  Excessive amounts of blood in the stool  Significant tenderness or worsening of abdominal pains  Swelling of the abdomen that is new, acute  Fever of 100F or higher   For urgent or emergent issues, a gastroenterologist can be reached at any hour by calling [phone].   DIET: Your first meal following the procedure should be a small meal and then it is ok to progress to your normal diet. Heavy or fried foods are harder to digest and may make you feel nauseous or bloated.  Likewise, meals heavy in dairy and vegetables can increase bloating.  Drink plenty of fluids but you should avoid alcoholic beverages for 24  hours.  ACTIVITY:  You should plan to take it easy for the rest of today and you should NOT DRIVE or use heavy machinery until tomorrow (because of the sedation medicines used during the test).    FOLLOW UP: Our staff will call the number listed on your records the next business day following your procedure to check on you and address any questions or concerns that you may have regarding the information given to you following your procedure. If we do not reach you, we will leave a message.  However, if you are feeling well and you are not experiencing any problems, there is no need to return our call.  We will assume that you have returned to your regular daily activities without incident.  If any biopsies were taken you will be contacted by phone or by letter within the next 1-3 weeks.  Please call us at [phone] if you have not heard about the biopsies in 3 weeks.    SIGNATURES/CONFIDENTIALITY: You and/or your care partner have signed paperwork which will be entered into your electronic medical record.  These signatures attest to the fact that that the information above on your After Visit Summary has been reviewed and is understood.  Full responsibility of the confidentiality of this discharge information lies with you and/or your care-partner.  Polyp information given.  

## 2015-07-13 NOTE — Progress Notes (Signed)
Called to room to assist during endoscopic procedure.  Patient ID and intended procedure confirmed with present staff. Received instructions for my participation in the procedure from the performing physician.  

## 2015-07-13 NOTE — Op Note (Signed)
[place]  [address] [place], [address]   COLONOSCOPY PROCEDURE REPORT  PATIENT: Bryan Archer, Bryan Archer  MR#: [id number] BIRTHDATE: 1965/09/06 , 49  yrs. old GENDER: male ENDOSCOPIST: Jerene Bears, MD REFERRED [id number]:[id number] [employer], M.D. PROCEDURE DATE:  07/13/2015 PROCEDURE:   Colonoscopy, screening and Colonoscopy with snare polypectomy First Screening Colonoscopy - Avg.  risk and is 50 yrs.  old or older Yes.  Prior Negative Screening - Now for repeat screening. N/A  History of Adenoma - Now for follow-up colonoscopy & has been > or = to 3 yrs.  N/A  Polyps removed today? Yes ASA CLASS:   Class II INDICATIONS:Screening for colonic neoplasia, Colorectal Neoplasm Risk Assessment for this procedure is average risk, and 1st colonoscopy. MEDICATIONS: Monitored anesthesia care and Propofol 300 mg IV  DESCRIPTION OF PROCEDURE:   After the risks benefits and alternatives of the procedure were thoroughly explained, informed consent was obtained.  The digital rectal exam revealed no rectal mass.   The LB [id number]  endoscope was introduced through the anus and advanced to the cecum, which was identified by both the appendix and ileocecal valve. No adverse events experienced. The quality of the prep was excellent.  (Suprep was used)  The instrument was then slowly withdrawn as the colon was fully examined. Estimated blood loss is zero unless otherwise noted in this procedure report.  COLON FINDINGS: A sessile polyp measuring 5 mm in size was found in the transverse colon.  A polypectomy was performed with a cold snare.  The resection was complete, the polyp tissue was completely retrieved and sent to histology.   The examination was otherwise normal.  Retroflexed views revealed internal hemorrhoids. The time to cecum = 4.1 Withdrawal time = 10.9   The scope was withdrawn and the procedure completed. COMPLICATIONS: There were no immediate complications.  ENDOSCOPIC  IMPRESSION: 1.   Sessile polyp was found in the transverse colon; polypectomy was performed with a cold snare 2.   The examination was otherwise normal  RECOMMENDATIONS: 1.  Await pathology results 2.  If the polyp removed today is proven to be an adenomatous (pre-cancerous) polyp, you will need a repeat colonoscopy in 5 years.  Otherwise you should continue to follow colorectal cancer screening guidelines for "routine risk" patients with colonoscopy in 10 years.  You will receive a letter within 1-2 weeks with the results of your biopsy as well as final recommendations.  Please call my office if you have not received a letter after 3 weeks.  eSigned:  Jerene Bears, MD 07/13/2015 9:29 AM   cc: Jill Alexanders, MD and The Patient

## 2015-07-14 ENCOUNTER — Telehealth: Payer: Self-pay | Admitting: *Deleted

## 2015-07-14 NOTE — Telephone Encounter (Signed)
Name identifier, left message, follow-up 

## 2015-07-18 ENCOUNTER — Encounter: Payer: Self-pay | Admitting: Internal Medicine

## 2015-07-20 ENCOUNTER — Other Ambulatory Visit (INDEPENDENT_AMBULATORY_CARE_PROVIDER_SITE_OTHER): Payer: 59

## 2015-07-20 DIAGNOSIS — Z23 Encounter for immunization: Secondary | ICD-10-CM

## 2015-12-24 ENCOUNTER — Other Ambulatory Visit: Payer: Self-pay | Admitting: Family Medicine

## 2016-01-02 ENCOUNTER — Encounter: Payer: Self-pay | Admitting: Family Medicine

## 2016-01-02 ENCOUNTER — Ambulatory Visit (INDEPENDENT_AMBULATORY_CARE_PROVIDER_SITE_OTHER): Payer: 59 | Admitting: Family Medicine

## 2016-01-02 VITALS — BP 114/78 | HR 67 | Ht 68.5 in | Wt 158.0 lb

## 2016-01-02 DIAGNOSIS — Z860100 Personal history of colon polyps, unspecified: Secondary | ICD-10-CM

## 2016-01-02 DIAGNOSIS — J309 Allergic rhinitis, unspecified: Secondary | ICD-10-CM | POA: Diagnosis not present

## 2016-01-02 DIAGNOSIS — Z1159 Encounter for screening for other viral diseases: Secondary | ICD-10-CM

## 2016-01-02 DIAGNOSIS — L719 Rosacea, unspecified: Secondary | ICD-10-CM | POA: Diagnosis not present

## 2016-01-02 DIAGNOSIS — Z209 Contact with and (suspected) exposure to unspecified communicable disease: Secondary | ICD-10-CM

## 2016-01-02 DIAGNOSIS — E785 Hyperlipidemia, unspecified: Secondary | ICD-10-CM

## 2016-01-02 DIAGNOSIS — J3089 Other allergic rhinitis: Secondary | ICD-10-CM

## 2016-01-02 DIAGNOSIS — G40909 Epilepsy, unspecified, not intractable, without status epilepticus: Secondary | ICD-10-CM

## 2016-01-02 LAB — COMPREHENSIVE METABOLIC PANEL
ALK PHOS: 51 U/L (ref 40–115)
ALT: 14 U/L (ref 9–46)
AST: 17 U/L (ref 10–35)
Albumin: 4.1 g/dL (ref 3.6–5.1)
BUN: 15 mg/dL (ref 7–25)
CALCIUM: 8.9 mg/dL (ref 8.6–10.3)
CO2: 31 mmol/L (ref 20–31)
Chloride: 104 mmol/L (ref 98–110)
Creat: 0.82 mg/dL (ref 0.70–1.33)
Glucose, Bld: 88 mg/dL (ref 65–99)
POTASSIUM: 4.5 mmol/L (ref 3.5–5.3)
Sodium: 140 mmol/L (ref 135–146)
TOTAL PROTEIN: 6.3 g/dL (ref 6.1–8.1)
Total Bilirubin: 0.4 mg/dL (ref 0.2–1.2)

## 2016-01-02 LAB — CBC WITH DIFFERENTIAL/PLATELET
BASOS PCT: 0 %
Basophils Absolute: 0 cells/uL (ref 0–200)
EOS ABS: 37 {cells}/uL (ref 15–500)
Eosinophils Relative: 1 %
HEMATOCRIT: 42.1 % (ref 38.5–50.0)
Hemoglobin: 14.6 g/dL (ref 13.2–17.1)
LYMPHS PCT: 30 %
Lymphs Abs: 1110 cells/uL (ref 850–3900)
MCH: 30.9 pg (ref 27.0–33.0)
MCHC: 34.7 g/dL (ref 32.0–36.0)
MCV: 89.2 fL (ref 80.0–100.0)
MONO ABS: 407 {cells}/uL (ref 200–950)
MPV: 9 fL (ref 7.5–12.5)
Monocytes Relative: 11 %
Neutro Abs: 2146 cells/uL (ref 1500–7800)
Neutrophils Relative %: 58 %
Platelets: 184 10*3/uL (ref 140–400)
RBC: 4.72 MIL/uL (ref 4.20–5.80)
RDW: 13.3 % (ref 11.0–15.0)
WBC: 3.7 10*3/uL — AB (ref 4.0–10.5)

## 2016-01-02 LAB — LIPID PANEL
CHOLESTEROL: 198 mg/dL (ref 125–200)
HDL: 86 mg/dL (ref >=40)
LDL Cholesterol: 102 mg/dL (ref <130)
TRIGLYCERIDES: 52 mg/dL (ref <150)
Total CHOL/HDL Ratio: 2.3 Ratio (ref <=5.0)
VLDL: 10 mg/dL (ref <30)

## 2016-01-02 MED ORDER — MONTELUKAST SODIUM 10 MG PO TABS: 10.0000 mg | ORAL_TABLET | Freq: Every day | ORAL | Status: DC

## 2016-01-02 MED ORDER — METRONIDAZOLE 1 % EX GEL: Freq: Every day | CUTANEOUS | Status: DC

## 2016-01-02 MED ORDER — PHENYTOIN SODIUM EXTENDED 100 MG PO CAPS: ORAL_CAPSULE | ORAL | Status: DC

## 2016-01-02 NOTE — Progress Notes (Signed)
   Subjective:    Patient ID: Bryan Archer, male    DOB: [birth year], 50 y.o.   MRN: [id number]  HPI  he is here for yearly checkup. He does have underlying allergies and does use Flonase, Xyzal, Singulair with good results. He would like to continue on this. He also continues on Dilantin for his underlying seizure disorder. He has not had any seizures in quite some time. He also uses metronidazole for rosacea and is doing quite well with this. He had a colonoscopy which did show tubular adenoma and is scheduled for follow-up colonoscopy in 5 years. Review of the record indicates hyperlipidemia however he has no other major risk factors. He now has a new job which is going well. He is in a long-term relationship in married 2 years ago. The relationship is monogamous however his partner is HIV positive. He has no other concerns or complaints.   Review of Systems     Objective:   Physical Exam Alert and in no distress. Tympanic membranes and canals are normal. Pharyngeal area is normal. Neck is supple without adenopathy or thyromegaly. Cardiac exam shows a regular sinus rhythm without murmurs or gallops. Lungs are clear to auscultation. Abdominal exam shows no masses or tenderness with normal bowel sounds.        Assessment & Plan:  Perennial allergic rhinitis - Plan: montelukast (SINGULAIR) 10 MG tablet  Seizure disorder (HCC) - Plan: CBC with Differential/Platelet, Comprehensive metabolic panel, Phenytoin level, free and total, phenytoin (DILANTIN) 100 MG ER capsule  Contact with or exposure to communicable disease - Plan: HIV antibody  History of colonic polyps -   tubular adenoma  Need for hepatitis C screening test - Plan: Hepatitis C antibody  Rosacea - Plan: metroNIDAZOLE (METROGEL) 1 % gel  Hyperlipidemia LDL goal <130 - Plan: Lipid panel

## 2016-01-03 LAB — HIV ANTIBODY (ROUTINE TESTING W REFLEX): HIV: NONREACTIVE

## 2016-01-03 LAB — HEPATITIS C ANTIBODY: HCV Ab: NEGATIVE

## 2016-01-05 LAB — PHENYTOIN LEVEL, FREE AND TOTAL
PHENYTOIN BOUND: 11.5 mg/L
Phenytoin, Free: 0.8 mg/L — ABNORMAL LOW (ref 1.0–2.0)
Phenytoin, Total: 12.3 mg/L (ref 10.0–20.0)

## 2016-08-25 DIAGNOSIS — Z23 Encounter for immunization: Secondary | ICD-10-CM | POA: Diagnosis not present

## 2016-10-13 ENCOUNTER — Other Ambulatory Visit: Payer: Self-pay | Admitting: Family Medicine

## 2016-10-13 DIAGNOSIS — G40909 Epilepsy, unspecified, not intractable, without status epilepticus: Secondary | ICD-10-CM

## 2016-10-15 NOTE — Telephone Encounter (Signed)
Sure he is scheduled for a follow-up sometime in May

## 2016-10-15 NOTE — Telephone Encounter (Signed)
Pt has appointment 5/18

## 2016-10-15 NOTE — Telephone Encounter (Signed)
Is this okay to refill? 

## 2016-12-28 ENCOUNTER — Encounter: Payer: Self-pay | Admitting: Family Medicine

## 2016-12-28 ENCOUNTER — Ambulatory Visit (INDEPENDENT_AMBULATORY_CARE_PROVIDER_SITE_OTHER): Payer: 59 | Admitting: Family Medicine

## 2016-12-28 VITALS — BP 112/68 | HR 70 | Ht 69.0 in | Wt 158.0 lb

## 2016-12-28 DIAGNOSIS — G40909 Epilepsy, unspecified, not intractable, without status epilepticus: Secondary | ICD-10-CM

## 2016-12-28 DIAGNOSIS — L719 Rosacea, unspecified: Secondary | ICD-10-CM | POA: Diagnosis not present

## 2016-12-28 DIAGNOSIS — Z79899 Other long term (current) drug therapy: Secondary | ICD-10-CM

## 2016-12-28 DIAGNOSIS — Z87891 Personal history of nicotine dependence: Secondary | ICD-10-CM | POA: Diagnosis not present

## 2016-12-28 DIAGNOSIS — Z209 Contact with and (suspected) exposure to unspecified communicable disease: Secondary | ICD-10-CM

## 2016-12-28 DIAGNOSIS — Z860100 Personal history of colon polyps, unspecified: Secondary | ICD-10-CM

## 2016-12-28 DIAGNOSIS — J3089 Other allergic rhinitis: Secondary | ICD-10-CM

## 2016-12-28 LAB — CBC WITH DIFFERENTIAL/PLATELET
BASOS ABS: 58 {cells}/uL (ref 0–200)
Basophils Relative: 1 %
EOS PCT: 2 %
Eosinophils Absolute: 116 cells/uL (ref 15–500)
HEMATOCRIT: 47 % (ref 38.5–50.0)
Hemoglobin: 15.9 g/dL (ref 13.2–17.1)
LYMPHS PCT: 27 %
Lymphs Abs: 1566 cells/uL (ref 850–3900)
MCH: 30.5 pg (ref 27.0–33.0)
MCHC: 33.8 g/dL (ref 32.0–36.0)
MCV: 90.2 fL (ref 80.0–100.0)
MPV: 9.2 fL (ref 7.5–12.5)
Monocytes Absolute: 580 cells/uL (ref 200–950)
Monocytes Relative: 10 %
NEUTROS PCT: 60 %
Neutro Abs: 3480 cells/uL (ref 1500–7800)
Platelets: 212 10*3/uL (ref 140–400)
RBC: 5.21 MIL/uL (ref 4.20–5.80)
RDW: 13.9 % (ref 11.0–15.0)
WBC: 5.8 10*3/uL (ref 4.0–10.5)

## 2016-12-28 LAB — COMPREHENSIVE METABOLIC PANEL
ALBUMIN: 4.5 g/dL (ref 3.6–5.1)
ALT: 14 U/L (ref 9–46)
AST: 17 U/L (ref 10–35)
Alkaline Phosphatase: 62 U/L (ref 40–115)
BILIRUBIN TOTAL: 0.4 mg/dL (ref 0.2–1.2)
BUN: 18 mg/dL (ref 7–25)
CALCIUM: 9.4 mg/dL (ref 8.6–10.3)
CHLORIDE: 104 mmol/L (ref 98–110)
CO2: 25 mmol/L (ref 20–31)
Creat: 0.99 mg/dL (ref 0.70–1.33)
GLUCOSE: 81 mg/dL (ref 65–99)
Potassium: 4.4 mmol/L (ref 3.5–5.3)
SODIUM: 140 mmol/L (ref 135–146)
Total Protein: 6.7 g/dL (ref 6.1–8.1)

## 2016-12-28 LAB — LIPID PANEL
Cholesterol: 229 mg/dL — ABNORMAL HIGH (ref <200)
HDL: 85 mg/dL (ref >40)
LDL CALC: 129 mg/dL — AB (ref <100)
TRIGLYCERIDES: 77 mg/dL (ref <150)
Total CHOL/HDL Ratio: 2.7 Ratio (ref <5.0)
VLDL: 15 mg/dL (ref <30)

## 2016-12-28 MED ORDER — METRONIDAZOLE 1 % EX GEL: Freq: Every day | CUTANEOUS | 11 refills | Status: DC

## 2016-12-28 MED ORDER — PHENYTOIN SODIUM EXTENDED 100 MG PO CAPS: ORAL_CAPSULE | ORAL | 3 refills | Status: DC

## 2016-12-28 MED ORDER — MONTELUKAST SODIUM 10 MG PO TABS: 10.0000 mg | ORAL_TABLET | Freq: Every day | ORAL | 3 refills | Status: DC

## 2016-12-28 NOTE — Progress Notes (Signed)
   Subjective:    Patient ID: Bryan Archer, male    DOB: July 25, 1966, 51 y.o.   MRN: [id number]  HPI He is here for an interval evaluation. He does have a seizure disorder and is on Dilantin. He has not had any seizure activity in several years. His allergies are under good control with Flonase, Xyzal and Singulair. He also uses metronidazole for his rosacea and is having no difficulty with that. He is involved in a long-term monogamous relationship. His partner is HIV positive. His work and home life are going quite well.   Review of Systems   Objective:   Physical Exam Alert and in no distress. Tympanic membranes and canals are normal. Pharyngeal area is normal. Neck is supple without adenopathy or thyromegaly. Cardiac exam shows a regular sinus rhythm without murmurs or gallops. Lungs are clear to auscultation.abdominal exam shows no masses or tenderness with normal bowel sounds. Genitalia normal.     Assessment & Plan:  History of colonic polyps - Plan: CBC with Differential/Platelet, Comprehensive metabolic panel  Perennial allergic rhinitis - Plan: montelukast (SINGULAIR) 10 MG tablet  Rosacea - Plan: metroNIDAZOLE (METROGEL) 1 % gel  Seizure disorder (HCC) - Plan: CBC with Differential/Platelet, Comprehensive metabolic panel, phenytoin (DILANTIN) 100 MG ER capsule  Exposure to communicable disease - Plan: CBC with Differential/Platelet, Comprehensive metabolic panel, HIV antibody  Encounter for long-term (current) use of medications - Plan: CBC with Differential/Platelet, Comprehensive metabolic panel, Lipid panel  Former smoker, stopped smoking in distant past He will have another colonoscopy in 2021. His allergies are under good control. He's had no difficulty with his rosacea. I will check HIV to be on the safe side.

## 2016-12-29 LAB — HIV ANTIBODY (ROUTINE TESTING W REFLEX): HIV: NONREACTIVE

## 2017-03-16 ENCOUNTER — Other Ambulatory Visit: Payer: Self-pay | Admitting: Family Medicine

## 2017-03-16 DIAGNOSIS — J3089 Other allergic rhinitis: Secondary | ICD-10-CM

## 2017-03-18 NOTE — Telephone Encounter (Signed)
Is this okay to refill? 

## 2017-05-23 ENCOUNTER — Other Ambulatory Visit: Payer: Self-pay | Admitting: Family Medicine

## 2017-05-23 DIAGNOSIS — Z23 Encounter for immunization: Secondary | ICD-10-CM | POA: Diagnosis not present

## 2017-05-23 DIAGNOSIS — G40909 Epilepsy, unspecified, not intractable, without status epilepticus: Secondary | ICD-10-CM

## 2017-06-27 ENCOUNTER — Ambulatory Visit: Payer: 59 | Admitting: Family Medicine

## 2017-06-27 VITALS — BP 112/60 | HR 64 | Resp 16 | Wt 161.0 lb

## 2017-06-27 DIAGNOSIS — M7711 Lateral epicondylitis, right elbow: Secondary | ICD-10-CM | POA: Diagnosis not present

## 2017-06-27 NOTE — Patient Instructions (Signed)
Tennis Elbow Tennis elbow is puffiness (inflammation) of the outer tendons of your forearm close to your elbow. Your tendons attach your muscles to your bones. Tennis elbow can happen in any sport or job in which you use your elbow too much. It is caused by doing the same motion over and over. Tennis elbow can cause:  Pain and tenderness in your forearm and the outer part of your elbow.  A burning feeling. This runs from your elbow through your arm.  Weak grip in your hands.  Follow these instructions at home: Activity  Rest your elbow and wrist as told by your doctor. Try to avoid any activities that caused the problem until your doctor says that you can do them again.  If a physical therapist teaches you exercises, do all of them as told.  If you lift an object, lift it with your palm facing up. This is easier on your elbow. Lifestyle  If your tennis elbow is caused by sports, check your equipment and make sure that: ? You are using it correctly. ? It fits you well.  If your tennis elbow is caused by work, take breaks often, if you are able. Talk with your manager about doing your work in a way that is safe for you. ? If your tennis elbow is caused by computer use, talk with your manager about any changes that can be made to your work setup. General instructions  If told, apply ice to the painful area: ? Put ice in a plastic bag. ? Place a towel between your skin and the bag. ? Leave the ice on for 20 minutes, 2-3 times per day.  Take medicines only as told by your doctor.  If you were given a brace, wear it as told by your doctor.  Keep all follow-up visits as told by your doctor. This is important. Contact a doctor if:  Your pain does not get better with treatment.  Your pain gets worse.  You have weakness in your forearm, hand, or fingers.  You cannot feel your forearm, hand, or fingers. This information is not intended to replace advice given to you by your health  care provider. Make sure you discuss any questions you have with your health care provider. Document Released: 01/17/2010 Document Revised: 03/29/2016 Document Reviewed: 07/26/2014 Do as many things as you can palms up and open Heat to the area for 20 minutes 3 times per day and also 2 Aleve twice per day      Elsevier Interactive Patient Education  [employer].

## 2017-06-27 NOTE — Progress Notes (Signed)
   Subjective:    Patient ID: Bryan Archer, male    DOB: 09/27/1965, 51 y.o.   MRN: [id number]  HPI He is here for evaluation of a 2-week history of right lateral elbow pain.  He does not have any history of overuse but does use his hands a lot with his work.  He has not had trouble with this in the past and has no other joints involved.  Review of Systems     Objective:   Physical Exam Alert and in no distress.  Tender to palpation over the lateral epicondyle.  Provocative testing increases the pain.       Assessment & Plan:  Lateral epicondylitis of right elbow I explained that this is an overuse type injury.  Recommend that he do his many things as he can palms up and open.  He is also to use heat and Aleve 2 pills twice per day.  Discuss more aggressive intervention with physical therapy and possibly steroid injection.

## 2017-07-23 DIAGNOSIS — J301 Allergic rhinitis due to pollen: Secondary | ICD-10-CM | POA: Diagnosis not present

## 2017-07-23 DIAGNOSIS — J3089 Other allergic rhinitis: Secondary | ICD-10-CM | POA: Diagnosis not present

## 2017-07-23 DIAGNOSIS — J3081 Allergic rhinitis due to animal (cat) (dog) hair and dander: Secondary | ICD-10-CM | POA: Diagnosis not present

## 2017-08-27 DIAGNOSIS — L739 Follicular disorder, unspecified: Secondary | ICD-10-CM | POA: Diagnosis not present

## 2017-08-27 DIAGNOSIS — L719 Rosacea, unspecified: Secondary | ICD-10-CM | POA: Diagnosis not present

## 2017-08-27 DIAGNOSIS — D229 Melanocytic nevi, unspecified: Secondary | ICD-10-CM | POA: Diagnosis not present

## 2017-10-28 ENCOUNTER — Other Ambulatory Visit: Payer: Self-pay | Admitting: Family Medicine

## 2017-10-28 DIAGNOSIS — G40909 Epilepsy, unspecified, not intractable, without status epilepticus: Secondary | ICD-10-CM

## 2018-01-27 ENCOUNTER — Other Ambulatory Visit: Payer: Self-pay | Admitting: Family Medicine

## 2018-01-27 DIAGNOSIS — J3089 Other allergic rhinitis: Secondary | ICD-10-CM

## 2018-02-15 DIAGNOSIS — M545 Low back pain: Secondary | ICD-10-CM | POA: Diagnosis not present

## 2018-03-24 ENCOUNTER — Encounter: Payer: Self-pay | Admitting: Family Medicine

## 2018-03-24 ENCOUNTER — Ambulatory Visit: Payer: 59 | Admitting: Family Medicine

## 2018-03-24 VITALS — BP 100/60 | HR 68 | Ht 68.5 in | Wt 150.8 lb

## 2018-03-24 DIAGNOSIS — M545 Low back pain: Secondary | ICD-10-CM

## 2018-03-24 LAB — POCT URINALYSIS DIP (PROADVANTAGE DEVICE)
Bilirubin, UA: NEGATIVE
Blood, UA: NEGATIVE
Glucose, UA: NEGATIVE mg/dL
Ketones, POC UA: NEGATIVE mg/dL
Nitrite, UA: NEGATIVE
PROTEIN UA: NEGATIVE mg/dL
SPECIFIC GRAVITY, URINE: 1.02
UUROB: NEGATIVE
pH, UA: 6 (ref 5.0–8.0)

## 2018-03-24 NOTE — Progress Notes (Signed)
   Subjective:    Patient ID: Bryan Archer, male    DOB: 07/21/1966, 52 y.o.   MRN: [id number]  HPI He injured his back July 1 in a work-related accident however this is not Copy.  He was eventually seen in an urgent care center and apparently x-rays were negative except for some arthritic changes.  He was given a steroid shot.  He states that he is now roughly 80% better.   Review of Systems     Objective:   Physical Exam Normal lumbar curve normal lumbar motion.  Negative straight leg raising.  Hip motion is normal.  No palpable tenderness is noted.       Assessment & Plan:  Low back pain, unspecified back pain laterality, unspecified chronicity, with sciatica presence unspecified - Plan: POCT Urinalysis DIP (Proadvantage Device) I explained that he is now 80% better.  Discussed proper posturing and lifting technique with him.  Also recommended gentle stretching exercises.  He is now walking without difficulty and I recommend that he start a walk jog program eventually increasing to jogging which he was doing in the past.  He will call if he has further difficulties. Also of note is he is thinking about moving to [place].

## 2018-04-03 ENCOUNTER — Other Ambulatory Visit: Payer: Self-pay | Admitting: Family Medicine

## 2018-04-03 DIAGNOSIS — G40909 Epilepsy, unspecified, not intractable, without status epilepticus: Secondary | ICD-10-CM

## 2018-04-04 NOTE — Telephone Encounter (Signed)
[facility] is requesting to fill pt phenytoin. Please advise [facility]

## 2018-04-22 ENCOUNTER — Encounter: Payer: Self-pay | Admitting: Family Medicine

## 2018-04-22 ENCOUNTER — Ambulatory Visit: Payer: 59 | Admitting: Family Medicine

## 2018-04-22 VITALS — BP 120/78 | HR 58 | Temp 97.5°F | Ht 68.0 in | Wt 154.2 lb

## 2018-04-22 DIAGNOSIS — Z86010 Personal history of colonic polyps: Secondary | ICD-10-CM

## 2018-04-22 DIAGNOSIS — E785 Hyperlipidemia, unspecified: Secondary | ICD-10-CM | POA: Diagnosis not present

## 2018-04-22 DIAGNOSIS — Z0000 Encounter for general adult medical examination without abnormal findings: Secondary | ICD-10-CM

## 2018-04-22 DIAGNOSIS — G40909 Epilepsy, unspecified, not intractable, without status epilepticus: Secondary | ICD-10-CM

## 2018-04-22 DIAGNOSIS — Z23 Encounter for immunization: Secondary | ICD-10-CM | POA: Diagnosis not present

## 2018-04-22 DIAGNOSIS — L719 Rosacea, unspecified: Secondary | ICD-10-CM | POA: Diagnosis not present

## 2018-04-22 DIAGNOSIS — J3089 Other allergic rhinitis: Secondary | ICD-10-CM

## 2018-04-22 LAB — POCT URINALYSIS DIP (PROADVANTAGE DEVICE)
Bilirubin, UA: NEGATIVE
Blood, UA: NEGATIVE
Glucose, UA: NEGATIVE mg/dL
Ketones, POC UA: NEGATIVE mg/dL
LEUKOCYTES UA: NEGATIVE
NITRITE UA: NEGATIVE
PH UA: 6 (ref 5.0–8.0)
PROTEIN UA: NEGATIVE mg/dL
Specific Gravity, Urine: 1.015
Urobilinogen, Ur: 3.5

## 2018-04-22 MED ORDER — METRONIDAZOLE 1 % EX GEL: Freq: Every day | CUTANEOUS | 11 refills | Status: AC

## 2018-04-22 NOTE — Progress Notes (Signed)
   Subjective:    Patient ID: Bryan Archer, male    DOB: 03/26/1966, 52 y.o.   MRN: [id number]  HPI He is here for a complete examination.  He does have an underlying seizure disorder and is doing well on Dilantin.  His allergies also seem to be under good control.  He does use Singulair and Xyzal for that.  He does have rosacea and continues on metronidazole topical.  He has no other concerns or complaints.  His work and home life are going well.  Family and social history as well as health maintenance and immunizations was reviewed   Review of Systems  All other systems reviewed and are negative.      Objective:   Physical Exam BP 120/78 (BP Location: Left Arm, Patient Position: Sitting)   Pulse (!) 58   Temp (!) 97.5 F (36.4 C)   Ht 5\' 8"  (1.727 m)   Wt 154 lb 3.2 oz (69.9 kg)   SpO2 99%   BMI 23.45 kg/m   General Appearance:    Alert, cooperative, no distress, appears stated age  Head:    Normocephalic, without obvious abnormality, atraumatic  Eyes:    PERRL, conjunctiva/corneas clear, EOM's intact, fundi    benign  Ears:    Normal TM's and external ear canals  Nose:   Nares normal, mucosa normal, no drainage or sinus   tenderness  Throat:   Lips, mucosa, and tongue normal; teeth and gums normal  Neck:   Supple, no lymphadenopathy;  thyroid:  no   enlargement/tenderness/nodules; no carotid   bruit or JVD  Back:    Spine nontender, no curvature, ROM normal, no CVA     tenderness  Lungs:     Clear to auscultation bilaterally without wheezes, rales or     ronchi; respirations unlabored      Heart:    Regular rate and rhythm, S1 and S2 normal, no murmur, rub   or gallop     Abdomen:     Soft, non-tender, nondistended, normoactive bowel sounds,    no masses, no hepatosplenomegaly  Genitalia:    Normal male external genitalia without lesions.  Testicles without masses.  No inguinal hernias.  Rectal:   Third  Extremities:   No clubbing, cyanosis or edema  Pulses:   2+ and  symmetric all extremities  Skin:   Skin color, texture, turgor normal, no rashes or lesions  Lymph nodes:   Cervical, supraclavicular, and axillary nodes normal  Neurologic:   CNII-XII intact, normal strength, sensation and gait; reflexes 2+ and symmetric throughout          Psych:   Normal mood, affect, hygiene and grooming.          Assessment & Plan:  Need for influenza vaccination - Plan: Flu Vaccine QUAD 6+ mos PF IM (Fluarix Quad PF)  Seizure disorder ([place]) - Plan: Phenytoin level, free  Rosacea  History of colonic polyps  Hyperlipidemia, unspecified hyperlipidemia type - Plan: Lipid panel  Perennial allergic rhinitis  Routine general medical examination at a health care facility - Plan: POCT Urinalysis DIP (Proadvantage Device) He will continue on his present medication regimen. He is scheduled for another colonoscopy in a couple of years.

## 2018-04-23 LAB — PHENYTOIN LEVEL, FREE: Phenytoin, Free: 0.9 ug/mL — ABNORMAL LOW (ref 1.0–2.0)

## 2018-04-23 LAB — LIPID PANEL
CHOLESTEROL TOTAL: 237 mg/dL — AB (ref 100–199)
Chol/HDL Ratio: 2.4 ratio (ref 0.0–5.0)
HDL: 100 mg/dL (ref >39)
LDL Calculated: 123 mg/dL — ABNORMAL HIGH (ref 0–99)
Triglycerides: 71 mg/dL (ref 0–149)
VLDL CHOLESTEROL CAL: 14 mg/dL (ref 5–40)

## 2018-12-28 ENCOUNTER — Other Ambulatory Visit: Payer: Self-pay | Admitting: Family Medicine

## 2018-12-28 DIAGNOSIS — J3089 Other allergic rhinitis: Secondary | ICD-10-CM

## 2019-01-19 ENCOUNTER — Other Ambulatory Visit: Payer: Self-pay | Admitting: Family Medicine

## 2019-01-19 DIAGNOSIS — G40909 Epilepsy, unspecified, not intractable, without status epilepticus: Secondary | ICD-10-CM

## 2019-01-19 NOTE — Telephone Encounter (Signed)
[facility] is requesting to fill pt Phenytoin. Please advise [facility]

## 2019-04-27 ENCOUNTER — Encounter: Payer: 59 | Admitting: Family Medicine

## 2019-06-28 ENCOUNTER — Other Ambulatory Visit: Payer: Self-pay | Admitting: Family Medicine

## 2019-06-28 DIAGNOSIS — G40909 Epilepsy, unspecified, not intractable, without status epilepticus: Secondary | ICD-10-CM

## 2019-06-29 NOTE — Telephone Encounter (Signed)
[facility] is requesting to fill pt phenytoin. Please advise. kh

## 2019-12-21 ENCOUNTER — Other Ambulatory Visit: Payer: Self-pay | Admitting: Family Medicine

## 2019-12-21 DIAGNOSIS — J3089 Other allergic rhinitis: Secondary | ICD-10-CM

## 2020-01-06 ENCOUNTER — Other Ambulatory Visit: Payer: Self-pay | Admitting: Family Medicine

## 2020-01-06 DIAGNOSIS — J3089 Other allergic rhinitis: Secondary | ICD-10-CM

## 2020-03-10 ENCOUNTER — Other Ambulatory Visit: Payer: Self-pay | Admitting: Family Medicine

## 2020-03-10 DIAGNOSIS — J3089 Other allergic rhinitis: Secondary | ICD-10-CM

## 2020-03-10 NOTE — Telephone Encounter (Signed)
Pt was called to find out if Dr. Redmond School is still his PCP. [place]

## 2020-03-11 NOTE — Telephone Encounter (Signed)
Pt called and states refills went to [place] in error. Pt now lives in [facility] and has a different PCP.

## 2020-05-21 ENCOUNTER — Other Ambulatory Visit: Payer: Self-pay | Admitting: Family Medicine

## 2020-05-21 DIAGNOSIS — G40909 Epilepsy, unspecified, not intractable, without status epilepticus: Secondary | ICD-10-CM

## 2020-05-23 NOTE — Telephone Encounter (Signed)
He has moved to [place].  It would be better for his doctor down there to take over care of this unless he plans to come back and see me

## 2020-05-23 NOTE — Telephone Encounter (Signed)
[facility] keep sending this med over and pt is no longer a pt due to him moving. Please refuse. [place]

## 2020-05-24 NOTE — Telephone Encounter (Signed)
Call him and see if he is seeing another doctor down in [place]. If not then I will call them for him.

## 2020-06-06 ENCOUNTER — Other Ambulatory Visit: Payer: Self-pay | Admitting: Family Medicine

## 2020-06-06 DIAGNOSIS — G40909 Epilepsy, unspecified, not intractable, without status epilepticus: Secondary | ICD-10-CM

## 2020-06-07 NOTE — Telephone Encounter (Signed)
[facility] is requesting to fill pt phenytoin. Please advise [facility]

## 2020-09-27 ENCOUNTER — Encounter: Payer: Self-pay | Admitting: Internal Medicine
